# Patient Record
Sex: Female | Born: 1985 | Race: White | Hispanic: No | Marital: Married | Smoking: Former smoker
Health system: Southern US, Community
[De-identification: ages and names within clinical notes are randomized; demographics above are authoritative.]

## PROBLEM LIST (undated history)

## (undated) DIAGNOSIS — R011 Cardiac murmur, unspecified: Secondary | ICD-10-CM

## (undated) HISTORY — PX: NO PAST SURGERIES: SHX2092

---

## 2006-12-10 ENCOUNTER — Emergency Department: Payer: Self-pay | Admitting: General Practice

## 2006-12-31 DIAGNOSIS — O24419 Gestational diabetes mellitus in pregnancy, unspecified control: Secondary | ICD-10-CM

## 2006-12-31 HISTORY — DX: Gestational diabetes mellitus in pregnancy, unspecified control: O24.419

## 2007-02-07 ENCOUNTER — Emergency Department: Payer: Self-pay

## 2007-02-08 ENCOUNTER — Emergency Department: Payer: Self-pay | Admitting: Emergency Medicine

## 2007-04-28 ENCOUNTER — Encounter: Payer: Self-pay | Admitting: Maternal & Fetal Medicine

## 2007-05-04 ENCOUNTER — Other Ambulatory Visit: Payer: Self-pay

## 2007-05-04 ENCOUNTER — Emergency Department: Payer: Self-pay | Admitting: Emergency Medicine

## 2007-05-15 ENCOUNTER — Encounter: Payer: Self-pay | Admitting: Maternal & Fetal Medicine

## 2007-06-18 ENCOUNTER — Inpatient Hospital Stay: Payer: Self-pay

## 2009-03-18 IMAGING — US US OB DETAIL+14 WK - NRPT [facility]
1 series · 14 of 28 positions shown · non-contrast
Comparison: none

[Series 1: us ob detail+14 wk - nrpt [facility] · 0.31mm/px · 14 of 81 slices shown]
[im 3/81]
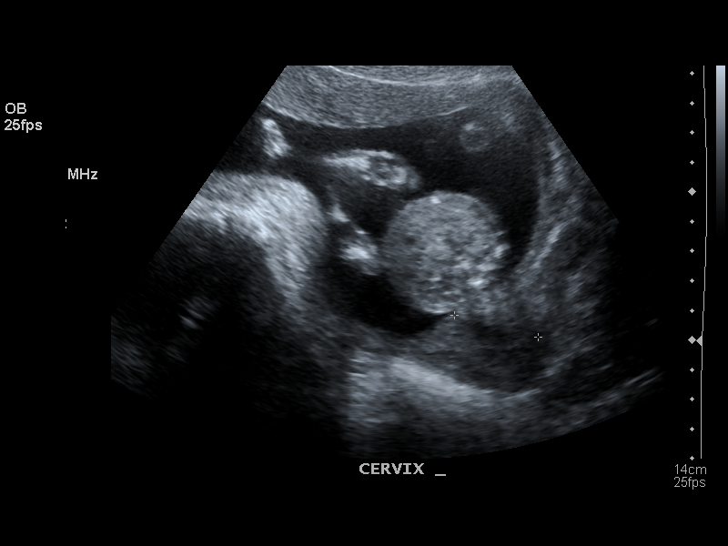
[im 9/81]
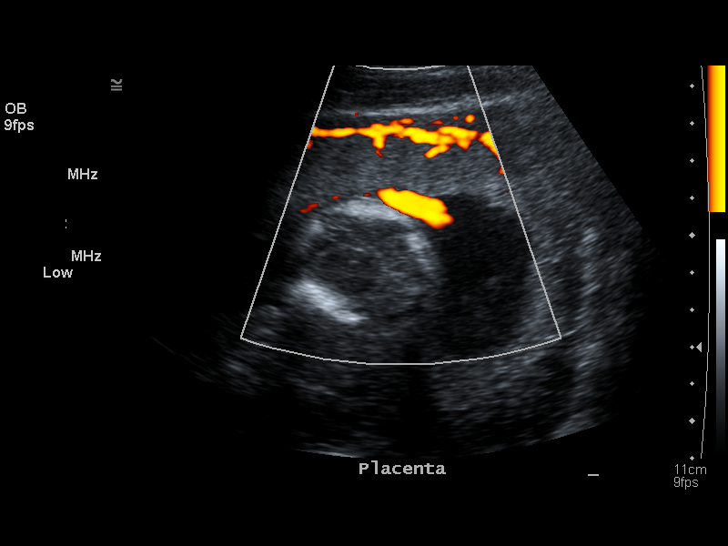
[im 15/81]
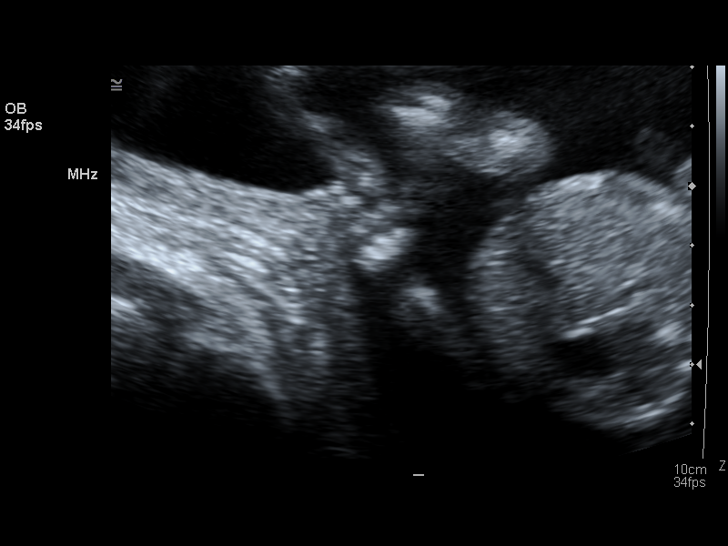
[im 21/81]
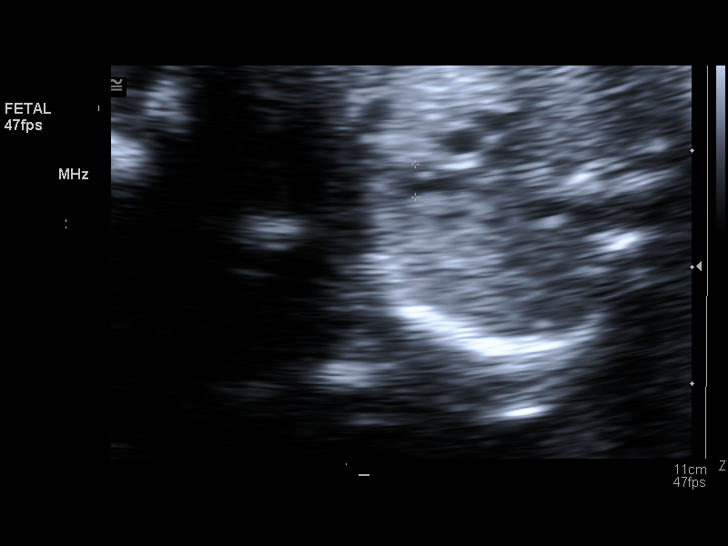
[im 27/81]
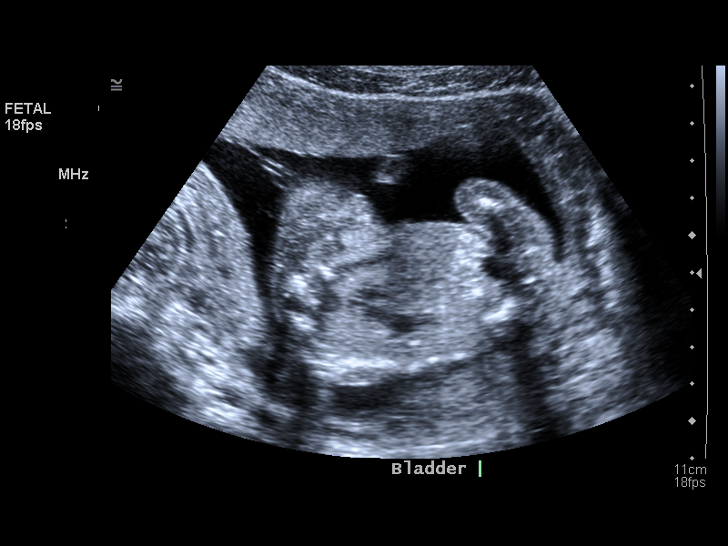
[im 33/81]
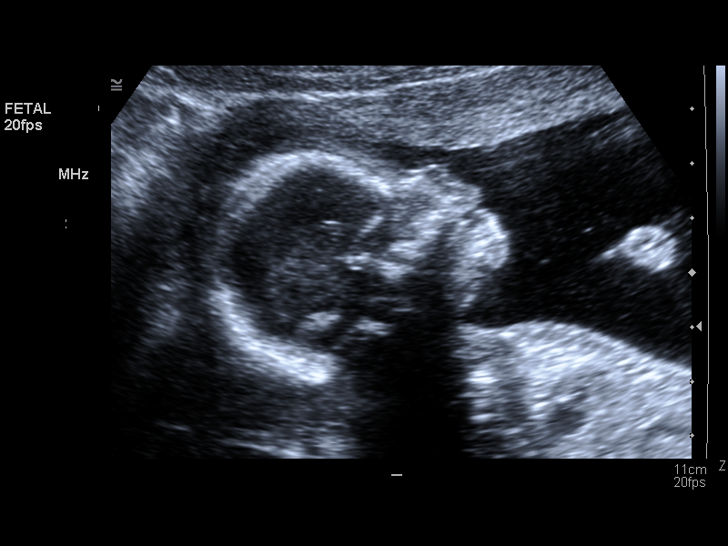
[im 39/81]
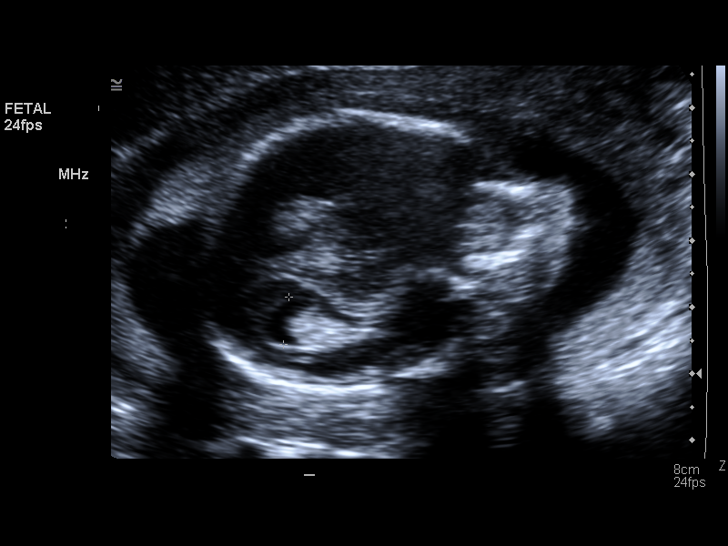
[im 45/81]
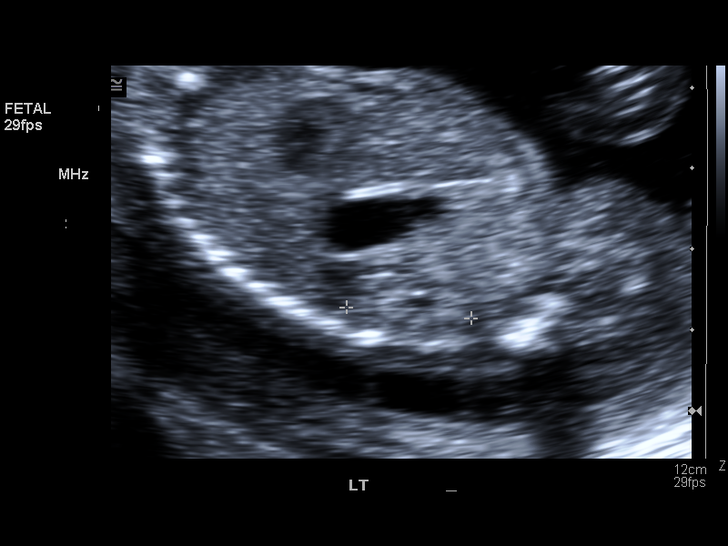
[im 51/81]
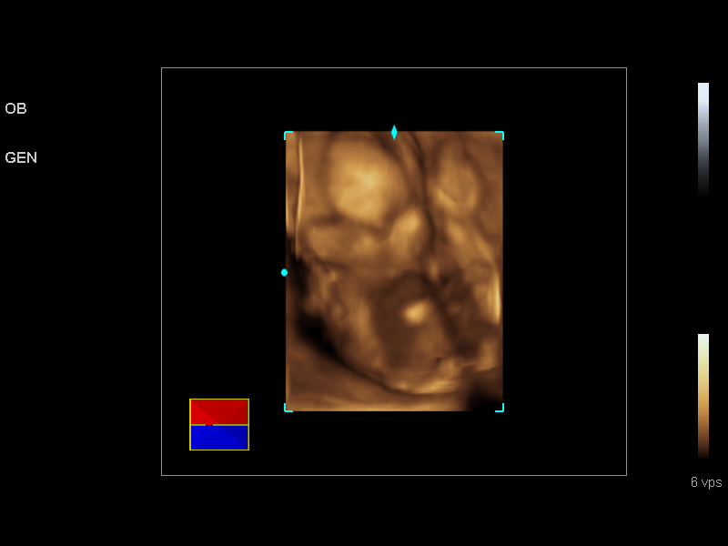
[im 57/81]
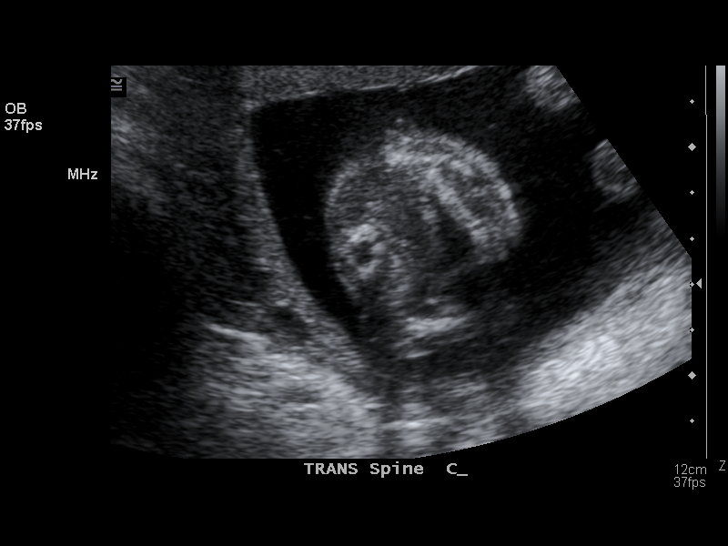
[im 63/81]
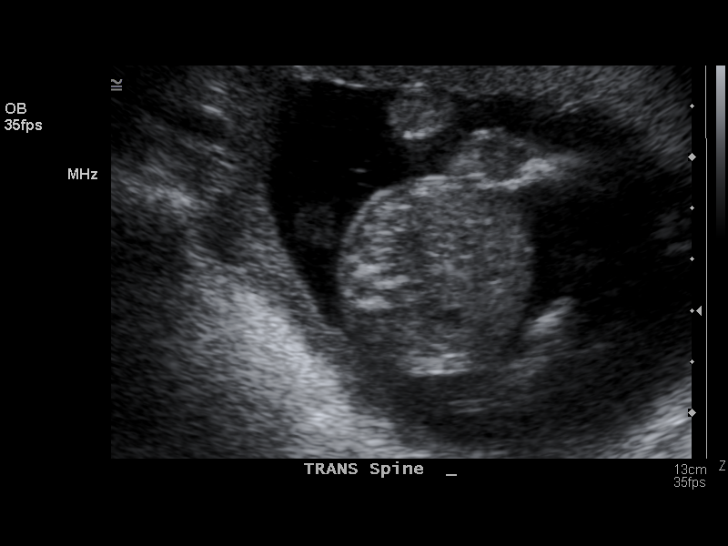
[im 69/81]
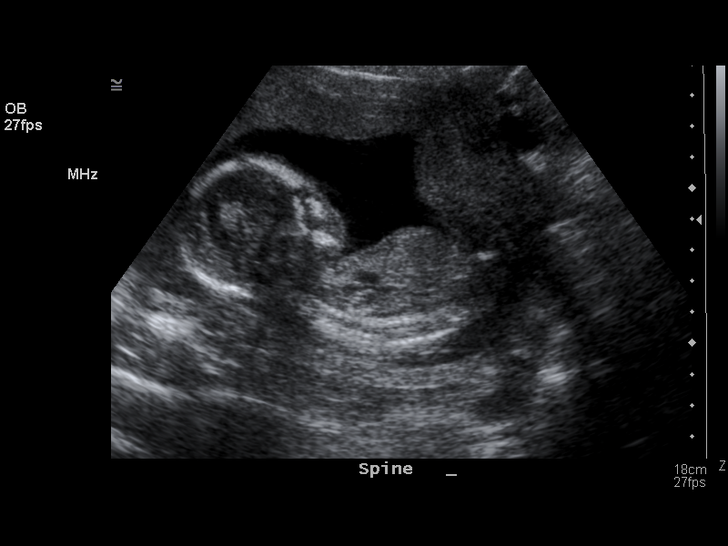
[im 75/81]
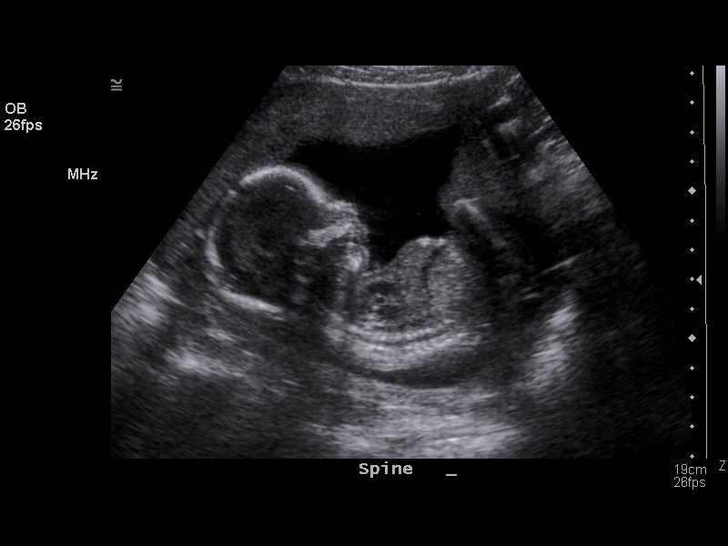
[im 81/81]
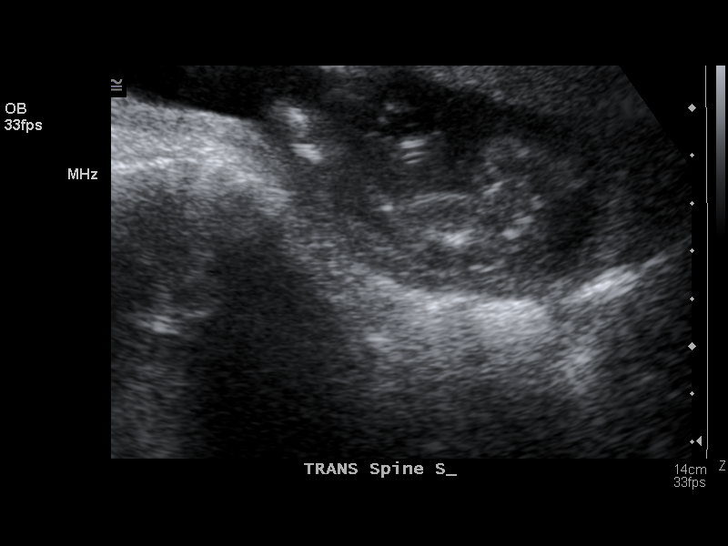

[14 of 28 positions shown; findings below may reference images not displayed]

IMAGES IMPORTED FROM THE [employer] WORKFLOW SYSTEM
NO DICTATION FOR STUDY

## 2017-04-12 ENCOUNTER — Emergency Department
Admission: EM | Admit: 2017-04-12 | Discharge: 2017-04-12 | Disposition: A | Discharge status: Home / Self Care | Payer: Self-pay | Attending: Emergency Medicine | Admitting: Emergency Medicine

## 2017-04-12 ENCOUNTER — Encounter: Payer: Self-pay | Admitting: Emergency Medicine

## 2017-04-12 DIAGNOSIS — K0889 Other specified disorders of teeth and supporting structures: Secondary | ICD-10-CM | POA: Insufficient documentation

## 2017-04-12 MED ORDER — IBUPROFEN 600 MG PO TABS: 600.0000 mg | ORAL_TABLET | Freq: Three times a day (TID) | ORAL | 0 refills | Status: DC | PRN

## 2017-04-12 MED ORDER — LIDOCAINE VISCOUS 2 % MT SOLN
15.0000 mL | Freq: Once | OROMUCOSAL | Status: AC
  Administered 2017-04-12: 15 mL via OROMUCOSAL
  Filled 2017-04-12: qty 15

## 2017-04-12 MED ORDER — TRAMADOL HCL 50 MG PO TABS: 50.0000 mg | ORAL_TABLET | Freq: Four times a day (QID) | ORAL | 0 refills | Status: DC | PRN

## 2017-04-12 MED ORDER — LIDOCAINE VISCOUS 2 % MT SOLN: 5.0000 mL | Freq: Four times a day (QID) | OROMUCOSAL | 0 refills | Status: DC | PRN

## 2017-04-12 NOTE — ED Provider Notes (Signed)
[facility] Emergency Department Provider Note   ____________________________________________   First MD Initiated Contact with Patient 04/12/17 1911     (approximate)  I have reviewed the triage vital signs and the nursing notes.   HISTORY  Chief Complaint Dental Pain    HPI Rebecca Blanchard is a 32 y.o. female patient complaining of dental pain to the right area but was sent tooth was removed 5 years ago. Patient stated this swelling and solid papular lesion at the site of extraction. Patient state pain as a 9/10. Patient described a pain as "penetrating. Patient denies any fever associated this complaint. No palliative measures taken for this complaint.   History reviewed. No pertinent past medical history.  There are no active problems to display for this patient.   No past surgical history on file.  Prior to Admission medications   Medication Sig Start Date End Date Taking? Authorizing Provider  ibuprofen (ADVIL,MOTRIN) 600 MG tablet Take 1 tablet (600 mg total) by mouth every 8 (eight) hours as needed. 04/12/17   Sable Feil, PA-C  lidocaine (XYLOCAINE) 2 % solution Use as directed 5 mLs in the mouth or throat every 6 (six) hours as needed for mouth pain. 04/12/17   Sable Feil, PA-C  traMADol (ULTRAM) 50 MG tablet Take 1 tablet (50 mg total) by mouth every 6 (six) hours as needed for moderate pain. 04/12/17   Sable Feil, PA-C    Allergies Amoxicillin  No family history on file.  Social History Social History  Substance Use Topics  . Smoking status: Not on file  . Smokeless tobacco: Not on file  . Alcohol use Not on file    Review of Systems Constitutional: No fever/chills Eyes: No visual changes. ENT: No sore throat. Dental pain Cardiovascular: Denies chest pain. Respiratory: Denies shortness of breath. Gastrointestinal: No abdominal pain.  No nausea, no vomiting.  No diarrhea.  No constipation. Genitourinary: Negative  for dysuria. Musculoskeletal: Negative for back pain. Skin: Negative for rash. Neurological: Negative for headaches, focal weakness or numbness.    ____________________________________________   PHYSICAL EXAM:  VITAL SIGNS: ED Triage Vitals  Enc Vitals Group     BP 04/12/17 1904 116/75     Pulse Rate 04/12/17 1904 89     Resp 04/12/17 1904 15     Temp 04/12/17 1904 98.7 F (37.1 C)     Temp Source 04/12/17 1904 Oral     SpO2 04/12/17 1904 96 %     Weight 04/12/17 1904 140 lb (63.5 kg)     Height 04/12/17 1904 5\' 5"  (1.651 m)     Head Circumference --      Peak Flow --      Pain Score 04/12/17 1903 9     Pain Loc --      Pain Edu? --      Excl. in [place]? --     Constitutional: Alert and oriented. Well appearing and in no acute distress. Eyes: Conjunctivae are normal. PERRL. EOMI. Head: Atraumatic. Nose: No congestion/rhinnorhea. Mouth/Throat: Mucous membranes are moist.  Oropharynx non-erythematous. Solid papular lesion at extraction site of tooth #32.  Neck: No stridor.  No cervical spine tenderness to palpation. Hematological/Lymphatic/Immunilogical: No cervical lymphadenopathy. Cardiovascular: Normal rate, regular rhythm. Grossly normal heart sounds.  Good peripheral circulation. Respiratory: Normal respiratory effort.  No retractions. Lungs CTAB. Gastrointestinal: Soft and nontender. No distention. No abdominal bruits. No CVA tenderness. Musculoskeletal: No lower extremity tenderness nor edema.  No joint  effusions. Neurologic:  Normal speech and language. No gross focal neurologic deficits are appreciated. No gait instability. Skin:  Skin is warm, dry and intact. No rash noted. Psychiatric: Mood and affect are normal. Speech and behavior are normal.  ____________________________________________   LABS (all labs ordered are listed, but only abnormal results are displayed)  Labs Reviewed - No data to  display ____________________________________________  EKG   ____________________________________________  RADIOLOGY   ____________________________________________   PROCEDURES  Procedure(s) performed: None  Procedures  Critical Care performed: No  ____________________________________________   INITIAL IMPRESSION / ASSESSMENT AND PLAN / ED COURSE  Pertinent labs & imaging results that were available during my care of the patient were reviewed by me and considered in my medical decision making (see chart for details).  Dental pain      ____________________________________________   FINAL CLINICAL IMPRESSION(S) / ED DIAGNOSES  Final diagnoses:  Pain, dental  Patient given discharge care instructions. Patient advised to [facility] for definitive evaluation and treatment.    NEW MEDICATIONS STARTED DURING THIS VISIT:  New Prescriptions   IBUPROFEN (ADVIL,MOTRIN) 600 MG TABLET    Take 1 tablet (600 mg total) by mouth every 8 (eight) hours as needed.   LIDOCAINE (XYLOCAINE) 2 % SOLUTION    Use as directed 5 mLs in the mouth or throat every 6 (six) hours as needed for mouth pain.   TRAMADOL (ULTRAM) 50 MG TABLET    Take 1 tablet (50 mg total) by mouth every 6 (six) hours as needed for moderate pain.     Note:  This document was prepared using Dragon voice recognition software and may include unintentional dictation errors.    Sable Feil, PA-C 04/12/17 1925    Harvest Dark, MD 04/12/17 2259

## 2017-04-12 NOTE — ED Notes (Signed)
Patient c/o lower right jaw pain at site of wisdom tooth (removed 7 years ago). Patient not able to fully open jaw.  Patient reports pain radiates to head and neck.

## 2017-04-12 NOTE — ED Triage Notes (Signed)
Pt reports pain to right side of mouth where wisdom tooth used to be.

## 2017-04-12 NOTE — ED Notes (Signed)
Reviewed d/c instructions, follow-up instructions, and prescriptions with patient. Pt verbalized understanding.

## 2017-07-10 ENCOUNTER — Emergency Department: Payer: Self-pay

## 2017-07-10 ENCOUNTER — Encounter: Payer: Self-pay | Admitting: Intensive Care

## 2017-07-10 ENCOUNTER — Emergency Department
Admission: EM | Admit: 2017-07-10 | Discharge: 2017-07-10 | Disposition: A | Discharge status: Home / Self Care | Payer: Self-pay | Attending: Student in an Organized Health Care Education/Training Program | Admitting: Student in an Organized Health Care Education/Training Program

## 2017-07-10 DIAGNOSIS — R202 Paresthesia of skin: Secondary | ICD-10-CM | POA: Insufficient documentation

## 2017-07-10 DIAGNOSIS — G510 Bell's palsy: Secondary | ICD-10-CM | POA: Insufficient documentation

## 2017-07-10 DIAGNOSIS — F17210 Nicotine dependence, cigarettes, uncomplicated: Secondary | ICD-10-CM | POA: Insufficient documentation

## 2017-07-10 LAB — COMPREHENSIVE METABOLIC PANEL
ALK PHOS: 40 U/L (ref 38–126)
ALT: 13 U/L — AB (ref 14–54)
AST: 20 U/L (ref 15–41)
Albumin: 4.1 g/dL (ref 3.5–5.0)
Anion gap: 9 (ref 5–15)
BILIRUBIN TOTAL: 0.7 mg/dL (ref 0.3–1.2)
BUN: 13 mg/dL (ref 6–20)
CALCIUM: 9 mg/dL (ref 8.9–10.3)
CO2: 22 mmol/L (ref 22–32)
CREATININE: 0.82 mg/dL (ref 0.44–1.00)
Chloride: 107 mmol/L (ref 101–111)
GFR calc Af Amer: >60 mL/min (ref >60)
GFR calc non Af Amer: >60 mL/min (ref >60)
Glucose, Bld: 110 mg/dL — ABNORMAL HIGH (ref 65–99)
POTASSIUM: 3.7 mmol/L (ref 3.5–5.1)
Sodium: 138 mmol/L (ref 135–145)
TOTAL PROTEIN: 7.1 g/dL (ref 6.5–8.1)

## 2017-07-10 LAB — DIFFERENTIAL
BASOS ABS: 0.1 10*3/uL (ref 0–0.1)
Basophils Relative: 1 %
EOS ABS: 0.1 10*3/uL (ref 0–0.7)
Eosinophils Relative: 2 %
LYMPHS ABS: 2.2 10*3/uL (ref 1.0–3.6)
LYMPHS PCT: 28 %
MONOS PCT: 7 %
Monocytes Absolute: 0.6 10*3/uL (ref 0.2–0.9)
Neutro Abs: 4.9 10*3/uL (ref 1.4–6.5)
Neutrophils Relative %: 62 %

## 2017-07-10 LAB — APTT: aPTT: 30 seconds (ref 24–36)

## 2017-07-10 LAB — TROPONIN I

## 2017-07-10 LAB — CBC
HEMATOCRIT: 36.9 % (ref 35.0–47.0)
HEMOGLOBIN: 12.5 g/dL (ref 12.0–16.0)
MCH: 28.8 pg (ref 26.0–34.0)
MCHC: 33.9 g/dL (ref 32.0–36.0)
MCV: 85 fL (ref 80.0–100.0)
Platelets: 222 10*3/uL (ref 150–440)
RBC: 4.34 MIL/uL (ref 3.80–5.20)
RDW: 13.7 % (ref 11.5–14.5)
WBC: 7.9 10*3/uL (ref 3.6–11.0)

## 2017-07-10 LAB — URINALYSIS, COMPLETE (UACMP) WITH MICROSCOPIC
Bilirubin Urine: NEGATIVE
Glucose, UA: NEGATIVE mg/dL
Hgb urine dipstick: NEGATIVE
Ketones, ur: NEGATIVE mg/dL
Leukocytes, UA: NEGATIVE
Nitrite: NEGATIVE
PH: 5 (ref 5.0–8.0)
Protein, ur: 30 mg/dL — AB
SPECIFIC GRAVITY, URINE: 1.033 — AB (ref 1.005–1.030)

## 2017-07-10 LAB — POCT PREGNANCY, URINE: PREG TEST UR: NEGATIVE

## 2017-07-10 LAB — PROTIME-INR
INR: 1.01
Prothrombin Time: 13.3 seconds (ref 11.4–15.2)

## 2017-07-10 LAB — PREGNANCY, URINE: PREG TEST UR: NEGATIVE

## 2017-07-10 MED ORDER — RANITIDINE HCL 150 MG PO CAPS: 150.0000 mg | ORAL_CAPSULE | Freq: Two times a day (BID) | ORAL | 0 refills | Status: DC

## 2017-07-10 MED ORDER — PREDNISONE 20 MG PO TABS
60.0000 mg | ORAL_TABLET | Freq: Every day | ORAL | 0 refills | Status: AC
Start: 2017-07-10 — End: 2017-07-17

## 2017-07-10 NOTE — ED Provider Notes (Signed)
 -----------------------------------------   6:18 PM on 07/10/2017 -----------------------------------------  Patient well appearing no acute distress, vitals remained normal. Repeat troponin negative. MRI essentially unremarkable. Patient informed of MRI findings including the potential schwannoma or paraganglioma, recommended she follow up with a primary care doctor for further evaluation. She is suitable for discharge home at this time. Eating and drinking, calm and comfortable.   Carrie Mew, MD 07/10/17 [phone]

## 2017-07-10 NOTE — Discharge Instructions (Signed)
Results for orders placed or performed during the hospital encounter of 07/10/17  Protime-INR  Result Value Ref Range   Prothrombin Time 13.3 11.4 - 15.2 seconds   INR 1.01   APTT  Result Value Ref Range   aPTT 30 24 - 36 seconds  CBC  Result Value Ref Range   WBC 7.9 3.6 - 11.0 K/uL   RBC 4.34 3.80 - 5.20 MIL/uL   Hemoglobin 12.5 12.0 - 16.0 g/dL   HCT 36.9 35.0 - 47.0 %   MCV 85.0 80.0 - 100.0 fL   MCH 28.8 26.0 - 34.0 pg   MCHC 33.9 32.0 - 36.0 g/dL   RDW 13.7 11.5 - 14.5 %   Platelets 222 150 - 440 K/uL  Differential  Result Value Ref Range   Neutrophils Relative % 62 %   Neutro Abs 4.9 1.4 - 6.5 K/uL   Lymphocytes Relative 28 %   Lymphs Abs 2.2 1.0 - 3.6 K/uL   Monocytes Relative 7 %   Monocytes Absolute 0.6 0.2 - 0.9 K/uL   Eosinophils Relative 2 %   Eosinophils Absolute 0.1 0 - 0.7 K/uL   Basophils Relative 1 %   Basophils Absolute 0.1 0 - 0.1 K/uL  Comprehensive metabolic panel  Result Value Ref Range   Sodium 138 135 - 145 mmol/L   Potassium 3.7 3.5 - 5.1 mmol/L   Chloride 107 101 - 111 mmol/L   CO2 22 22 - 32 mmol/L   Glucose, Bld 110 (H) 65 - 99 mg/dL   BUN 13 6 - 20 mg/dL   Creatinine, Ser 0.82 0.44 - 1.00 mg/dL   Calcium 9.0 8.9 - 10.3 mg/dL   Total Protein 7.1 6.5 - 8.1 g/dL   Albumin 4.1 3.5 - 5.0 g/dL   AST 20 15 - 41 U/L   ALT 13 (L) 14 - 54 U/L   Alkaline Phosphatase 40 38 - 126 U/L   Total Bilirubin 0.7 0.3 - 1.2 mg/dL   GFR calc non Af Amer >60 >60 mL/min   GFR calc Af Amer >60 >60 mL/min   Anion gap 9 5 - 15  Troponin I  Result Value Ref Range   Troponin I <0.03 <0.03 ng/mL  Urinalysis, Complete w Microscopic  Result Value Ref Range   Color, Urine YELLOW (A) YELLOW   APPearance HAZY (A) CLEAR   Specific Gravity, Urine 1.033 (H) 1.005 - 1.030   pH 5.0 5.0 - 8.0   Glucose, UA NEGATIVE NEGATIVE mg/dL   Hgb urine dipstick NEGATIVE NEGATIVE   Bilirubin Urine NEGATIVE NEGATIVE   Ketones, ur NEGATIVE NEGATIVE mg/dL   Protein, ur 30 (A)  NEGATIVE mg/dL   Nitrite NEGATIVE NEGATIVE   Leukocytes, UA NEGATIVE NEGATIVE   RBC / HPF 0-5 0 - 5 RBC/hpf   WBC, UA 0-5 0 - 5 WBC/hpf   Bacteria, UA RARE (A) NONE SEEN   Squamous Epithelial / LPF 6-30 (A) NONE SEEN   Mucous PRESENT   Pregnancy, urine  Result Value Ref Range   Preg Test, Ur NEGATIVE NEGATIVE  Troponin I  Result Value Ref Range   Troponin I <0.03 <0.03 ng/mL  Pregnancy, urine POC  Result Value Ref Range   Preg Test, Ur NEGATIVE NEGATIVE   Ct Head Blanchard Contrast  Result Date: 07/10/2017 CLINICAL DATA:  Numbness on face and right from. EXAM: CT HEAD WITHOUT CONTRAST TECHNIQUE: Contiguous axial images were obtained from the base of the skull through the vertex without intravenous contrast. COMPARISON:  None.  FINDINGS: Brain: No mass lesion, hemorrhage, hydrocephalus, acute infarct, intra-axial, or extra-axial fluid collection. Vascular: No hyperdense vessel or unexpected calcification. Skull: Normal Sinuses/Orbits: Normal imaged portions of the orbits and globes. Clear paranasal sinuses and mastoid air cells. Other: None. IMPRESSION: Normal head CT. Electronically Signed   By: Rebecca Blanchard M.D.   On: 07/10/2017 11:56   Rebecca Blanchard Contrast  Result Date: 07/10/2017 CLINICAL DATA:  32 y/o F; right facial weakness and tingling of the right upper extremity. EXAM: MRI HEAD WITHOUT CONTRAST MRI CERVICAL SPINE WITHOUT CONTRAST TECHNIQUE: Multiplanar, multiecho pulse sequences of the brain and surrounding structures, and cervical spine, to include the craniocervical junction and cervicothoracic junction, were obtained without intravenous contrast. COMPARISON:  None. FINDINGS: MRI HEAD FINDINGS Brain: No acute infarction, hemorrhage, hydrocephalus, extra-axial collection or mass lesion. No abnormal T2 FLAIR signal abnormality of the brain. Vascular: Normal flow voids. Skull and upper cervical spine: Normal marrow signal. Sinuses/Orbits: Mild ethmoid and right maxillary sinus mucosal  thickening. Right maxillary sinus mucous retention cyst. No abnormal signal of mastoid air cells. Orbits are normal. Other: None. MRI CERVICAL SPINE FINDINGS Alignment: Physiologic. Vertebrae: No fracture, evidence of discitis, or bone lesion. Cord: Normal signal and morphology. Posterior Fossa, vertebral arteries, paraspinal tissues: T2 hyperintense lesion measuring 8 x 10 x 16 mm (AP x ML x CC series 16, image 21 and series 14, image 5 within the right prevertebral space centered between common carotid artery and esophagus at C6-7 level. T2 signal intensities increased compared to lymph nodes in the neck.) Disc levels: C2-3: No significant disc displacement, foraminal narrowing, or canal stenosis. C3-4: No significant disc displacement, foraminal narrowing, or canal stenosis. C4-5: Minimal disc bulge. No significant foraminal or canal stenosis. C5-6: Small central disc protrusion with ventral thecal sac effacement and minimal anterior cord flattening. No significant foraminal or canal stenosis. C6-7: Minimal disc bulge. No significant foraminal or canal stenosis. C7-T1: No significant disc displacement, foraminal narrowing, or canal stenosis. IMPRESSION: MRI head: 1. No acute intracranial abnormality. 2. Mild ethmoid and right maxillary sinus disease. 3. Otherwise unremarkable MRI of the brain. MRI cervical spine: 1. Mild discogenic degenerative changes greatest at the C5-6 level with there is mild disc space narrowing and a small central protrusion with ventral thecal sac effacement and minimal anterior cord flattening. 2. No significant foraminal or canal stenosis. No acute osseous abnormality. 3. 16 mm lesion in the right prevertebral space at the C6-7 level, possibly a schwannoma, paraganglioma, or reactive lymph node. Consider CT or MRI of the neck with contrast for further evaluation. Electronically Signed   By: Rebecca Blanchard M.D.   On: 07/10/2017 17:17   Rebecca Blanchard Contrast  Result  Date: 07/10/2017 CLINICAL DATA:  32 y/o F; right facial weakness and tingling of the right upper extremity. EXAM: MRI HEAD WITHOUT CONTRAST MRI CERVICAL SPINE WITHOUT CONTRAST TECHNIQUE: Multiplanar, multiecho pulse sequences of the brain and surrounding structures, and cervical spine, to include the craniocervical junction and cervicothoracic junction, were obtained without intravenous contrast. COMPARISON:  None. FINDINGS: MRI HEAD FINDINGS Brain: No acute infarction, hemorrhage, hydrocephalus, extra-axial collection or mass lesion. No abnormal T2 FLAIR signal abnormality of the brain. Vascular: Normal flow voids. Skull and upper cervical spine: Normal marrow signal. Sinuses/Orbits: Mild ethmoid and right maxillary sinus mucosal thickening. Right maxillary sinus mucous retention cyst. No abnormal signal of mastoid air cells. Orbits are normal. Other: None. MRI CERVICAL SPINE FINDINGS Alignment: Physiologic. Vertebrae: No fracture, evidence of discitis, or bone lesion.  Cord: Normal signal and morphology. Posterior Fossa, vertebral arteries, paraspinal tissues: T2 hyperintense lesion measuring 8 x 10 x 16 mm (AP x ML x CC series 16, image 21 and series 14, image 5 within the right prevertebral space centered between common carotid artery and esophagus at C6-7 level. T2 signal intensities increased compared to lymph nodes in the neck.) Disc levels: C2-3: No significant disc displacement, foraminal narrowing, or canal stenosis. C3-4: No significant disc displacement, foraminal narrowing, or canal stenosis. C4-5: Minimal disc bulge. No significant foraminal or canal stenosis. C5-6: Small central disc protrusion with ventral thecal sac effacement and minimal anterior cord flattening. No significant foraminal or canal stenosis. C6-7: Minimal disc bulge. No significant foraminal or canal stenosis. C7-T1: No significant disc displacement, foraminal narrowing, or canal stenosis. IMPRESSION: MRI head: 1. No acute  intracranial abnormality. 2. Mild ethmoid and right maxillary sinus disease. 3. Otherwise unremarkable MRI of the brain. MRI cervical spine: 1. Mild discogenic degenerative changes greatest at the C5-6 level with there is mild disc space narrowing and a small central protrusion with ventral thecal sac effacement and minimal anterior cord flattening. 2. No significant foraminal or canal stenosis. No acute osseous abnormality. 3. 16 mm lesion in the right prevertebral space at the C6-7 level, possibly a schwannoma, paraganglioma, or reactive lymph node. Consider CT or MRI of the neck with contrast for further evaluation. Electronically Signed   By: Rebecca Blanchard M.D.   On: 07/10/2017 17:17

## 2017-07-10 NOTE — ED Notes (Signed)
Pt verbalizes understanding of d/c teaching and RX. Pt denies any questions. RN did teach back.

## 2017-07-10 NOTE — ED Notes (Signed)
Pt to ed with c/o face numbness yesterday, states "yesterday around 1pm, when I made a kiss face the Right side of my face wouldn't move" Patient c/o numbness on face and Right thumb radiating up arm. No weakness noted. Bilateral hand grips strong Speech clear. No problems swallowing. No facial drooping noted.  Pt alert and oriented and appears in nad at this time.  Family at bedside.

## 2017-07-10 NOTE — ED Notes (Signed)
Sandwich tray given at this time 

## 2017-07-10 NOTE — ED Triage Notes (Signed)
PAtient states "yesterday around 1pm, when I make a kiss face the Right side of my face wouldn't move" Patient c/o numbness on face and Right thumb radiating up arm. No weakness noted. Bilateral hand grips strong Speech clear. No problems swallowing.

## 2017-07-10 NOTE — ED Notes (Addendum)
Pt awaiting MRI 

## 2017-07-10 NOTE — ED Provider Notes (Signed)
[facility] Emergency Department Provider Note    First MD Initiated Contact with Patient 07/10/17 1413     (approximate)  I have reviewed the triage vital signs and the nursing notes.   HISTORY  Chief Complaint Numbness    HPI TORRANCE FRECH is a 32 y.o. female presents with weakness and tingling to her right face around her mouth associated with tingling over the right thumb that started yesterday at 1 PM. Patient denies any headache. No trauma. No recent illness. Never had symptoms like this before. Denies any associated blurry vision. No slurred speech. No weakness. Denies any chest pain or neck pain.   History reviewed. No pertinent past medical history. History reviewed. No pertinent family history. History reviewed. No pertinent surgical history. There are no active problems to display for this patient.     Prior to Admission medications   Medication Sig Start Date End Date Taking? Authorizing Provider  ibuprofen (ADVIL,MOTRIN) 600 MG tablet Take 1 tablet (600 mg total) by mouth every 8 (eight) hours as needed. 04/12/17   Sable Feil, PA-C  lidocaine (XYLOCAINE) 2 % solution Use as directed 5 mLs in the mouth or throat every 6 (six) hours as needed for mouth pain. 04/12/17   Sable Feil, PA-C  traMADol (ULTRAM) 50 MG tablet Take 1 tablet (50 mg total) by mouth every 6 (six) hours as needed for moderate pain. 04/12/17   Sable Feil, PA-C    Allergies Amoxicillin    Social History Social History  Substance Use Topics  . Smoking status: Current Every Day Smoker    Packs/day: 0.50    Types: Cigarettes  . Smokeless tobacco: Never Used  . Alcohol use No    Review of Systems Patient denies headaches, rhinorrhea, blurry vision, numbness, shortness of breath, chest pain, edema, cough, abdominal pain, nausea, vomiting, diarrhea, dysuria, fevers, rashes or hallucinations unless otherwise stated above in  HPI. ____________________________________________   PHYSICAL EXAM:  VITAL SIGNS: Vitals:   07/10/17 1125  BP: (!) 129/57  Pulse: 61  Resp: 18  Temp: 98.9 F (37.2 C)    Constitutional: Alert and oriented. Well appearing and in no acute distress. Eyes: Conjunctivae are normal.  Head: Atraumatic. Nose: No congestion/rhinnorhea. Mouth/Throat: Mucous membranes are moist.   Neck: No stridor. Painless ROM.  Cardiovascular: Normal rate, regular rhythm. Grossly normal heart sounds.  Good peripheral circulation. Respiratory: Normal respiratory effort.  No retractions. Lungs CTAB. Gastrointestinal: Soft and nontender. No distention. No abdominal bruits. No CVA tenderness. Musculoskeletal: No lower extremity tenderness nor edema.  No joint effusions. Neurologic:  Normal speech and language. No gross focal neurologic deficits are appreciated. Minimal weakness to right face sparring the forehead.  No overt droop.  Subjective numbness or right thumb. Skin:  Skin is warm, dry and intact. No rash noted. Psychiatric: Mood and affect are normal. Speech and behavior are normal.  ____________________________________________   LABS (all labs ordered are listed, but only abnormal results are displayed)  Results for orders placed or performed during the hospital encounter of 07/10/17 (from the past 24 hour(s))  Protime-INR     Status: None   Collection Time: 07/10/17 11:30 AM  Result Value Ref Range   Prothrombin Time 13.3 11.4 - 15.2 seconds   INR 1.01   APTT     Status: None   Collection Time: 07/10/17 11:30 AM  Result Value Ref Range   aPTT 30 24 - 36 seconds  CBC  Status: None   Collection Time: 07/10/17 11:30 AM  Result Value Ref Range   WBC 7.9 3.6 - 11.0 K/uL   RBC 4.34 3.80 - 5.20 MIL/uL   Hemoglobin 12.5 12.0 - 16.0 g/dL   HCT 36.9 35.0 - 47.0 %   MCV 85.0 80.0 - 100.0 fL   MCH 28.8 26.0 - 34.0 pg   MCHC 33.9 32.0 - 36.0 g/dL   RDW 13.7 11.5 - 14.5 %   Platelets 222 150 -  440 K/uL  Differential     Status: None   Collection Time: 07/10/17 11:30 AM  Result Value Ref Range   Neutrophils Relative % 62 %   Neutro Abs 4.9 1.4 - 6.5 K/uL   Lymphocytes Relative 28 %   Lymphs Abs 2.2 1.0 - 3.6 K/uL   Monocytes Relative 7 %   Monocytes Absolute 0.6 0.2 - 0.9 K/uL   Eosinophils Relative 2 %   Eosinophils Absolute 0.1 0 - 0.7 K/uL   Basophils Relative 1 %   Basophils Absolute 0.1 0 - 0.1 K/uL  Comprehensive metabolic panel     Status: Abnormal   Collection Time: 07/10/17 11:30 AM  Result Value Ref Range   Sodium 138 135 - 145 mmol/L   Potassium 3.7 3.5 - 5.1 mmol/L   Chloride 107 101 - 111 mmol/L   CO2 22 22 - 32 mmol/L   Glucose, Bld 110 (H) 65 - 99 mg/dL   BUN 13 6 - 20 mg/dL   Creatinine, Ser 0.82 0.44 - 1.00 mg/dL   Calcium 9.0 8.9 - 10.3 mg/dL   Total Protein 7.1 6.5 - 8.1 g/dL   Albumin 4.1 3.5 - 5.0 g/dL   AST 20 15 - 41 U/L   ALT 13 (L) 14 - 54 U/L   Alkaline Phosphatase 40 38 - 126 U/L   Total Bilirubin 0.7 0.3 - 1.2 mg/dL   GFR calc non Af Amer >60 >60 mL/min   GFR calc Af Amer >60 >60 mL/min   Anion gap 9 5 - 15  Troponin I     Status: None   Collection Time: 07/10/17 11:30 AM  Result Value Ref Range   Troponin I <0.03 <0.03 ng/mL  Urinalysis, Complete w Microscopic     Status: Abnormal   Collection Time: 07/10/17 11:36 AM  Result Value Ref Range   Color, Urine YELLOW (A) YELLOW   APPearance HAZY (A) CLEAR   Specific Gravity, Urine 1.033 (H) 1.005 - 1.030   pH 5.0 5.0 - 8.0   Glucose, UA NEGATIVE NEGATIVE mg/dL   Hgb urine dipstick NEGATIVE NEGATIVE   Bilirubin Urine NEGATIVE NEGATIVE   Ketones, ur NEGATIVE NEGATIVE mg/dL   Protein, ur 30 (A) NEGATIVE mg/dL   Nitrite NEGATIVE NEGATIVE   Leukocytes, UA NEGATIVE NEGATIVE   RBC / HPF 0-5 0 - 5 RBC/hpf   WBC, UA 0-5 0 - 5 WBC/hpf   Bacteria, UA RARE (A) NONE SEEN   Squamous Epithelial / LPF 6-30 (A) NONE SEEN   Mucous PRESENT   Pregnancy, urine POC     Status: None   Collection  Time: 07/10/17 11:39 AM  Result Value Ref Range   Preg Test, Ur NEGATIVE NEGATIVE   ____________________________________________  EKG My review and personal interpretation at Time: 11:24   Indication: tingling  Rate: 65  Rhythm: sinus Axis: normal Other:  Normal intervals, no avute abnormality ____________________________________________  RADIOLOGY  I personally reviewed all radiographic images ordered to evaluate for the above acute complaints  and reviewed radiology reports and findings.  These findings were personally discussed with the patient.  Please see medical record for radiology report.  ____________________________________________   PROCEDURES  Procedure(s) performed:  Procedures    Critical Care performed: no ____________________________________________   INITIAL IMPRESSION / ASSESSMENT AND PLAN / ED COURSE  Pertinent labs & imaging results that were available during my care of the patient were reviewed by me and considered in my medical decision making (see chart for details).  DDX: bells, cva, ms, neuropathy  CARLESHA SEIPLE is a 32 y.o. who presents to the ED with right facial tingling injury as described above. CT imaging ordered evaluate for mass or ischemia shows none. Blood work is reassuring. Based on her presentation do feel patient will require MRI to further evaluate for acute ischemic event. Do suspect some partial Bell's palsy as etiology of symptoms. Patient be signed out to Dr. Joni Fears pending results of MRI.  Have discussed with the patient and available family all diagnostics and treatments performed thus far and all questions were answered to the best of my ability. The patient demonstrates understanding and agreement with plan.       ____________________________________________   FINAL CLINICAL IMPRESSION(S) / ED DIAGNOSES  Final diagnoses:  Paresthesias      NEW MEDICATIONS STARTED DURING THIS VISIT:  New Prescriptions   No  medications on file     Note:  This document was prepared using Dragon voice recognition software and may include unintentional dictation errors.    Merlyn Lot, MD 07/10/17 [phone]

## 2017-07-10 NOTE — ED Notes (Signed)
Patient transported to MRI 

## 2017-07-10 NOTE — ED Notes (Signed)
Called alea in the lab and added urine preg to labs.

## 2017-10-15 ENCOUNTER — Encounter: Payer: Self-pay | Admitting: Emergency Medicine

## 2017-10-15 ENCOUNTER — Emergency Department: Payer: Self-pay

## 2017-10-15 ENCOUNTER — Emergency Department
Admission: EM | Admit: 2017-10-15 | Discharge: 2017-10-15 | Disposition: A | Discharge status: Home / Self Care | Payer: Self-pay | Attending: Emergency Medicine | Admitting: Emergency Medicine

## 2017-10-15 DIAGNOSIS — F17210 Nicotine dependence, cigarettes, uncomplicated: Secondary | ICD-10-CM | POA: Insufficient documentation

## 2017-10-15 DIAGNOSIS — M940 Chondrocostal junction syndrome [Tietze]: Secondary | ICD-10-CM | POA: Insufficient documentation

## 2017-10-15 DIAGNOSIS — R0789 Other chest pain: Secondary | ICD-10-CM

## 2017-10-15 MED ORDER — CYCLOBENZAPRINE HCL 5 MG PO TABS: 5.0000 mg | ORAL_TABLET | Freq: Three times a day (TID) | ORAL | 0 refills | Status: DC | PRN

## 2017-10-15 MED ORDER — PREDNISONE 10 MG (21) PO TBPK: ORAL_TABLET | ORAL | 0 refills | Status: DC

## 2017-10-15 NOTE — ED Provider Notes (Signed)
[facility] Department Provider Note   ____________________________________________   I have reviewed the triage vital signs and the nursing notes.   HISTORY  Chief Complaint Chest Pain    HPI Rebecca Blanchard is a 32 y.o. female Presents to the emergency department with chest wall pain that has persisted for several days. Patient reports pain with breathing and with palpation along the chest wall. Patient denies any traumatic injury contributing to current symptoms. Patient denies any recent illness associated with current symptoms as well. Patiently notes recently she has been coughing a lot and that has contributed to increasing pain along the chest wall. Patient is a half a pack per day smoker. She states she has been a smoker majority of her life. Patient denies fever, chills, headache, vision changes, chest pain, chest tightness, shortness of breath, abdominal pain, nausea and vomiting.  History reviewed. No pertinent past medical history.  There are no active problems to display for this patient.   History reviewed. No pertinent surgical history.  Prior to Admission medications   Medication Sig Start Date End Date Taking? Authorizing Provider  cyclobenzaprine (FLEXERIL) 5 MG tablet Take 1 tablet (5 mg total) by mouth 3 (three) times daily as needed. 10/15/17   Little, Traci M, PA-C  ibuprofen (ADVIL,MOTRIN) 600 MG tablet Take 1 tablet (600 mg total) by mouth every 8 (eight) hours as needed. 04/12/17   Sable Feil, PA-C  lidocaine (XYLOCAINE) 2 % solution Use as directed 5 mLs in the mouth or throat every 6 (six) hours as needed for mouth pain. Patient not taking: Reported on 07/10/2017 04/12/17   Sable Feil, PA-C  predniSONE (STERAPRED UNI-PAK 21 TAB) 10 MG (21) TBPK tablet Take 6 tablets on day 1. Take 5 tablets on day 2. Take 4 tablets on day 3. Take 3 tablets on day 4. Take 2 tablets on day 5. Take 1 tablets on day 6. 10/15/17    Little, Traci M, PA-C  ranitidine (ZANTAC) 150 MG capsule Take 1 capsule (150 mg total) by mouth 2 (two) times daily. 07/10/17   Carrie Mew, MD  traMADol (ULTRAM) 50 MG tablet Take 1 tablet (50 mg total) by mouth every 6 (six) hours as needed for moderate pain. Patient not taking: Reported on 07/10/2017 04/12/17   Sable Feil, PA-C    Allergies Amoxicillin  No family history on file.  Social History Social History  Substance Use Topics  . Smoking status: Current Every Day Smoker    Packs/day: 0.50    Types: Cigarettes  . Smokeless tobacco: Never Used  . Alcohol use No    Review of Systems Constitutional: Negative for fever/chills ENT:  Negative for sore throat and for difficulty swallowing Cardiovascular: positive for chest wall pain Respiratory: nonproductive intermittent cough. Denies shortness of breath. Musculoskeletal: positive for chest wall pain Skin: Negative for rash. Neurological: Negative for headaches. Able to ambulate. ____________________________________________   PHYSICAL EXAM:  VITAL SIGNS: ED Triage Vitals  Enc Vitals Group     BP 10/15/17 1738 124/73     Pulse Rate 10/15/17 1738 88     Resp 10/15/17 1738 16     Temp 10/15/17 1738 99.4 F (37.4 C)     Temp Source 10/15/17 1738 Oral     SpO2 10/15/17 1738 98 %     Weight 10/15/17 1739 150 lb (68 kg)     Height 10/15/17 1739 5\' 5"  (1.651 m)     Head Circumference --  Peak Flow --      Pain Score 10/15/17 1740 2     Pain Loc --      Pain Edu? --      Excl. in [place]? --     Constitutional: Alert and oriented. Well appearing and in no acute distress.  Head: Normocephalic and atraumatic. Cardiovascular: Normal rate, regular rhythm.  Respiratory: Normal respiratory effort without tachypnea or retractions. Lungs CTAB. No wheezes/rales/rhonchi. Good air entry to the bases with no decreased or absent breath sounds. Gastrointestinal: Bowel sounds 4 quadrants. Soft and nontender to palpation.    Musculoskeletal: Nontender with normal range of motion in all extremities. Neurologic: Normal speech and language. Chest wall tender to palpation along anterior ribs and sternum. No deformities or asymmetry noted.  Skin:  Skin is warm, dry and intact. No rash noted. Psychiatric: Mood and affect are normal. Speech and behavior are normal. Patient exhibits appropriate insight and judgement.  ____________________________________________   LABS (all labs ordered are listed, but only abnormal results are displayed)  Labs Reviewed - No data to display ____________________________________________  EKG none ____________________________________________  RADIOLOGY DG chest  IMPRESSION: Mild bronchitic changes without infiltrate. ____________________________________________   PROCEDURES  Procedure(s) performed: no    Critical Care performed: no ____________________________________________   INITIAL IMPRESSION / ASSESSMENT AND PLAN / ED COURSE  Pertinent labs & imaging results that were available during my care of the patient were reviewed by me and considered in my medical decision making (see chart for details).  Patient presents to emergency department with chest wall pain for several days with traumatic injury. History, physical exam findings and imaging are consistent with costochondritis. Radiograph imaging review and unremarkable for fracture or clinical relevance to patient's symptoms.  Patient will be prescribed prednisone taper and flexeril for muscle spasm . Patient advised to follow up with PCP as needed or return to the emergency department if symptoms return or worsen. Patient informed of clinical course, understand medical decision-making process, and agree with plan.  ____________________________________________   FINAL CLINICAL IMPRESSION(S) / ED DIAGNOSES  Final diagnoses:  Chest wall pain  Costochondritis       NEW MEDICATIONS STARTED DURING THIS  VISIT:  New Prescriptions   CYCLOBENZAPRINE (FLEXERIL) 5 MG TABLET    Take 1 tablet (5 mg total) by mouth 3 (three) times daily as needed.   PREDNISONE (STERAPRED UNI-PAK 21 TAB) 10 MG (21) TBPK TABLET    Take 6 tablets on day 1. Take 5 tablets on day 2. Take 4 tablets on day 3. Take 3 tablets on day 4. Take 2 tablets on day 5. Take 1 tablets on day 6.     Note:  This document was prepared using Dragon voice recognition software and may include unintentional dictation errors.    Jerolyn Shin, PA-C 10/15/17 Penne Lash, MD 10/16/17 [phone]

## 2017-10-15 NOTE — ED Triage Notes (Signed)
Pt with bilateral rib pain, only has pain upon palpation.

## 2017-10-15 NOTE — ED Notes (Signed)

## 2017-10-15 NOTE — Discharge Instructions (Signed)
Take medication as prescribed. Return to emergency department if symptoms worsen and follow-up with PCP as needed.   °

## 2018-05-05 ENCOUNTER — Encounter: Payer: Self-pay | Admitting: Emergency Medicine

## 2018-05-05 ENCOUNTER — Emergency Department
Admission: EM | Admit: 2018-05-05 | Discharge: 2018-05-05 | Disposition: A | Discharge status: Home / Self Care | Payer: Self-pay | Attending: Emergency Medicine | Admitting: Emergency Medicine

## 2018-05-05 DIAGNOSIS — F17210 Nicotine dependence, cigarettes, uncomplicated: Secondary | ICD-10-CM | POA: Insufficient documentation

## 2018-05-05 DIAGNOSIS — L0231 Cutaneous abscess of buttock: Secondary | ICD-10-CM | POA: Insufficient documentation

## 2018-05-05 MED ORDER — LIDOCAINE HCL (PF) 1 % IJ SOLN
5.0000 mL | Freq: Once | INTRAMUSCULAR | Status: DC
  Filled 2018-05-05: qty 5

## 2018-05-05 MED ORDER — OXYCODONE-ACETAMINOPHEN 7.5-325 MG PO TABS
1.0000 | ORAL_TABLET | Freq: Once | ORAL | Status: AC
  Administered 2018-05-05: 1 via ORAL
  Filled 2018-05-05: qty 1

## 2018-05-05 MED ORDER — SULFAMETHOXAZOLE-TRIMETHOPRIM 800-160 MG PO TABS: 1.0000 | ORAL_TABLET | Freq: Two times a day (BID) | ORAL | 0 refills | Status: DC

## 2018-05-05 MED ORDER — ONDANSETRON 4 MG PO TBDP
4.0000 mg | ORAL_TABLET | Freq: Once | ORAL | Status: AC
  Administered 2018-05-05: 4 mg via ORAL
  Filled 2018-05-05: qty 1

## 2018-05-05 MED ORDER — HYDROCODONE-ACETAMINOPHEN 5-325 MG PO TABS: 1.0000 | ORAL_TABLET | ORAL | 0 refills | Status: DC | PRN

## 2018-05-05 NOTE — ED Provider Notes (Signed)
[facility] Emergency Department Provider Note  ____________________________________________   First MD Initiated Contact with Patient 05/05/18 [phone]     (approximate)  I have reviewed the triage vital signs and the nursing notes.   HISTORY  Chief Complaint Abscess   HPI Rebecca Blanchard is a 33 y.o. female is here today with her husband with complaint of an abscess in the vaginal area.  Patient states this started last week.  She states that is progressively gotten worse until she cannot sit without pain.  Patient denies any previous problems such as this.  She denies any fever or chills.  Patient is adamant that we not use any needles and voices that she does not want to be here at all.  Currently she rates her pain as a 10/10.   History reviewed. No pertinent past medical history.  There are no active problems to display for this patient.   History reviewed. No pertinent surgical history.  Prior to Admission medications   Medication Sig Start Date End Date Taking? Authorizing Provider  HYDROcodone-acetaminophen (NORCO/VICODIN) 5-325 MG tablet Take 1 tablet by mouth every 4 (four) hours as needed for moderate pain. 05/05/18   Johnn Hai, PA-C  sulfamethoxazole-trimethoprim (BACTRIM DS,SEPTRA DS) 800-160 MG tablet Take 1 tablet by mouth 2 (two) times daily. 05/05/18   Johnn Hai, PA-C    Allergies Amoxicillin  No family history on file.  Social History Social History   Tobacco Use  . Smoking status: Current Every Day Smoker    Packs/day: 0.50    Types: Cigarettes  . Smokeless tobacco: Never Used  Substance Use Topics  . Alcohol use: No  . Drug use: Not on file    Review of Systems Constitutional: No fever/chills Cardiovascular: Denies chest pain. Respiratory: Denies shortness of breath. Gastrointestinal: No abdominal pain.  No nausea, no vomiting.  Genitourinary: Negative for dysuria. Musculoskeletal: Negative for back  pain. Skin: Positive for abscess. Neurological: Negative for headaches, focal weakness or numbness. ___________________________________________   PHYSICAL EXAM:  VITAL SIGNS: ED Triage Vitals  Enc Vitals Group     BP 05/05/18 0834 121/63     Pulse Rate 05/05/18 0834 74     Resp 05/05/18 0834 18     Temp 05/05/18 0834 98.4 F (36.9 C)     Temp Source 05/05/18 0834 Oral     SpO2 05/05/18 0834 98 %     Weight 05/05/18 0835 161 lb (73 kg)     Height 05/05/18 0835 5\' 5"  (1.651 m)     Head Circumference --      Peak Flow --      Pain Score 05/05/18 0837 10     Pain Loc --      Pain Edu? --      Excl. in [place]? --    Constitutional: Alert and oriented. Well appearing and in no acute distress.  Patient is fearful, crying, distraught. Eyes: Conjunctivae are normal.  Head: Atraumatic. Neck: No stridor.   Cardiovascular: Normal rate, regular rhythm. Grossly normal heart sounds.  Good peripheral circulation. Respiratory: Normal respiratory effort.  No retractions. Lungs CTAB. Gastrointestinal: Soft and nontender. No distention.  Genitourinary: External genitalia is unremarkable on exam. Musculoskeletal: No lower extremity tenderness nor edema.   Neurologic:  Normal speech and language. No gross focal neurologic deficits are appreciated. Skin:  Skin is warm, dry and intact.  There is approximately a 2.5 cm erythematous area that is extremely tender to palpation on the proximal  aspect of the posterior thigh.  This is near the crease of the junction of the thigh and buttocks.  Patient is unable to tolerate a full exam because of pain and her  fearfulness Psychiatric: Mood and affect are normal. Speech and behavior are normal.  ____________________________________________   LABS (all labs ordered are listed, but only abnormal results are displayed)  Labs Reviewed - No data to display  PROCEDURES  Procedure(s) performed:   Marland KitchenMarland KitchenIncision and Drainage Date/Time: 05/05/2018 4:00 PM Performed  by: Johnn Hai, PA-C Authorized by: Johnn Hai, PA-C   Consent:    Consent obtained:  Verbal   Consent given by:  Patient and spouse   Risks discussed:  Pain and incomplete drainage Location:    Type:  Abscess   Location:  Lower extremity   Lower extremity location:  Buttock   Buttock location:  R buttock Pre-procedure details:    Skin preparation:  Antiseptic wash Procedure type:    Complexity:  Simple Procedure details:    Incision types:  Single straight   Incision depth:  Subcutaneous   Scalpel blade:  11   Wound management:  Probed and deloculated   Drainage:  Purulent   Drainage amount:  Moderate   Wound treatment:  Drain placed   Packing materials:  1/2 in iodoform gauze Post-procedure details:    Patient tolerance of procedure:  Tolerated well, no immediate complications    Critical Care performed: No  ____________________________________________   INITIAL IMPRESSION / ASSESSMENT AND PLAN / ED COURSE  As part of my medical decision making, I reviewed the following data within the electronic MEDICAL RECORD NUMBER Notes from prior ED visits and [place] Controlled Substance Database  Patient was given Percocet prior to procedure and did well in comparison to her initial assessment.  Patient was made aware that she would need to return in 2 days or see her PCP for drain removal if it has not already fallen out.  Patient was started on Bactrim DS twice daily for 10 days along with Norco as needed for pain.  Patient will return sooner if any worsening of her symptoms.  ____________________________________________   FINAL CLINICAL IMPRESSION(S) / ED DIAGNOSES  Final diagnoses:  Abscess of buttock, right     ED Discharge Orders        Ordered    sulfamethoxazole-trimethoprim (BACTRIM DS,SEPTRA DS) 800-160 MG tablet  2 times daily     05/05/18 1115    HYDROcodone-acetaminophen (NORCO/VICODIN) 5-325 MG tablet  Every 4 hours PRN     05/05/18 1115        Note:  This document was prepared using Dragon voice recognition software and may include unintentional dictation errors.    Johnn Hai, PA-C 05/05/18 1603    Harvest Dark, MD 05/07/18 [phone]

## 2018-05-05 NOTE — Discharge Instructions (Addendum)
Follow-up with your primary care doctor or return to the emergency department in 2 days for packing removal.  Begin taking antibiotics as directed twice a day for the next 10 days and Norco for pain as needed.  Do not drive or operate machinery while taking Norco.  After packing is out you may use warm moist compresses to the area.

## 2018-05-05 NOTE — ED Notes (Signed)
See triage note  Presents with possible abscess area to right buttock/groin area  States developed area last week  Having increased pain this am

## 2018-05-05 NOTE — ED Triage Notes (Signed)
Pt reports abscess to vaginal area since last week getting worse. Pt states Internet said to place warm compress and let burst on its own but pt reports she has and it has gotten more painful.

## 2018-11-05 ENCOUNTER — Encounter: Payer: Self-pay | Admitting: Emergency Medicine

## 2018-11-05 ENCOUNTER — Emergency Department
Admission: EM | Admit: 2018-11-05 | Discharge: 2018-11-05 | Disposition: A | Discharge status: Home / Self Care | Payer: Self-pay | Attending: Emergency Medicine | Admitting: Emergency Medicine

## 2018-11-05 ENCOUNTER — Other Ambulatory Visit: Payer: Self-pay

## 2018-11-05 DIAGNOSIS — N390 Urinary tract infection, site not specified: Secondary | ICD-10-CM | POA: Insufficient documentation

## 2018-11-05 DIAGNOSIS — Z87891 Personal history of nicotine dependence: Secondary | ICD-10-CM | POA: Insufficient documentation

## 2018-11-05 LAB — URINALYSIS, COMPLETE (UACMP) WITH MICROSCOPIC
BACTERIA UA: NONE SEEN
Bilirubin Urine: NEGATIVE
Glucose, UA: NEGATIVE mg/dL
Ketones, ur: NEGATIVE mg/dL
Nitrite: POSITIVE — AB
PH: 7 (ref 5.0–8.0)
PROTEIN: NEGATIVE mg/dL
Specific Gravity, Urine: 1.012 (ref 1.005–1.030)

## 2018-11-05 LAB — POCT PREGNANCY, URINE: Preg Test, Ur: NEGATIVE

## 2018-11-05 MED ORDER — NITROFURANTOIN MONOHYD MACRO 100 MG PO CAPS: 100.0000 mg | ORAL_CAPSULE | Freq: Two times a day (BID) | ORAL | 0 refills | Status: AC

## 2018-11-05 MED ORDER — PHENAZOPYRIDINE HCL 200 MG PO TABS: 200.0000 mg | ORAL_TABLET | Freq: Three times a day (TID) | ORAL | 0 refills | Status: AC | PRN

## 2018-11-05 NOTE — ED Provider Notes (Signed)
[facility] Emergency Department Provider Note  ____________________________________________   First MD Initiated Contact with Patient 11/05/18 1426     (approximate)  I have reviewed the triage vital signs and the nursing notes.   HISTORY  Chief Complaint Hematuria    HPI LYNNETT LANGLINAIS is a 33 y.o. female presents emergency department complaining of UTI symptoms.  She states she has had burning with urination and pain.  She also states she has had some hesitancy.  She denies any fever, chills, vomiting.  She states she was seen at the health department for a yearly physical on Friday.  They placed her on Flagyl for a bacterial vaginosis infection.  Chlamydia and gonorrhea test are pending from the health department.    History reviewed. No pertinent past medical history.  There are no active problems to display for this patient.   History reviewed. No pertinent surgical history.  Prior to Admission medications   Medication Sig Start Date End Date Taking? Authorizing Provider  nitrofurantoin, macrocrystal-monohydrate, (MACROBID) 100 MG capsule Take 1 capsule (100 mg total) by mouth 2 (two) times daily. 11/05/18   Florentine Diekman, Linden Dolin, PA-C  phenazopyridine (PYRIDIUM) 200 MG tablet Take 1 tablet (200 mg total) by mouth 3 (three) times daily as needed for pain. 11/05/18   Versie Starks, PA-C    Allergies Amoxicillin  History reviewed. No pertinent family history.  Social History Social History   Tobacco Use  . Smoking status: Former Smoker    Packs/day: 0.50    Types: Cigarettes    Last attempt to quit: 10/31/2017    Years since quitting: 1.0  . Smokeless tobacco: Never Used  Substance Use Topics  . Alcohol use: No  . Drug use: Not on file    Review of Systems  Constitutional: No fever/chills Eyes: No visual changes. ENT: No sore throat. Respiratory: Denies cough Genitourinary: Positive for dysuria. Musculoskeletal: Negative for back  pain. Skin: Negative for rash.    ____________________________________________   PHYSICAL EXAM:  VITAL SIGNS: ED Triage Vitals  Enc Vitals Group     BP 11/05/18 1402 126/72     Pulse Rate 11/05/18 1402 76     Resp 11/05/18 1402 18     Temp 11/05/18 1402 98.2 F (36.8 C)     Temp src --      SpO2 11/05/18 1402 98 %     Weight 11/05/18 1407 165 lb (74.8 kg)     Height 11/05/18 1407 5\' 5"  (1.651 m)     Head Circumference --      Peak Flow --      Pain Score 11/05/18 1406 5     Pain Loc --      Pain Edu? --      Excl. in [place]? --     Constitutional: Alert and oriented. Well appearing and in no acute distress. Eyes: Conjunctivae are normal.  Head: Atraumatic. Nose: No congestion/rhinnorhea. Mouth/Throat: Mucous membranes are moist.   Neck:  supple no lymphadenopathy noted Cardiovascular: Normal rate, regular rhythm. Heart sounds are normal Respiratory: Normal respiratory effort.  No retractions, lungs c t a  Abd: soft nontender bs normal all 4 quad, no CVA tenderness is noted GU: deferred Musculoskeletal: FROM all extremities, warm and well perfused Neurologic:  Normal speech and language.  Skin:  Skin is warm, dry and intact. No rash noted. Psychiatric: Mood and affect are normal. Speech and behavior are normal.  ____________________________________________   LABS (all labs ordered are  listed, but only abnormal results are displayed)  Labs Reviewed  URINALYSIS, COMPLETE (UACMP) WITH MICROSCOPIC - Abnormal; Notable for the following components:      Result Value   Color, Urine AMBER (*)    APPearance CLEAR (*)    Hgb urine dipstick MODERATE (*)    Nitrite POSITIVE (*)    Leukocytes, UA TRACE (*)    All other components within normal limits  URINE CULTURE  POC URINE PREG, ED  POCT PREGNANCY, URINE    ____________________________________________   ____________________________________________  RADIOLOGY    ____________________________________________   PROCEDURES  Procedure(s) performed: No  Procedures    ____________________________________________   INITIAL IMPRESSION / ASSESSMENT AND PLAN / ED COURSE  Pertinent labs & imaging results that were available during my care of the patient were reviewed by me and considered in my medical decision making (see chart for details).   Patient is a 33 year old female presents emergency department complaint of UTI symptoms.  On physical exam patient appears well.  The UA is positive for white blood cell clumps and nitrites  Explained the findings to the patient.  She is to continue the Flagyl she was provided at the health department.  She is given a prescription for Macrobid and Pyridium.  She is to drink plenty of water.  Follow-up with health department.  She states she understands will comply.  She was discharged in stable condition.     As part of my medical decision making, I reviewed the following data within the [place] notes reviewed and incorporated, Labs reviewed POC pregnant negative, UA positive for WBC clumps, trace leuks, and nitrites, Old chart reviewed, Notes from prior ED visits and [place] Controlled Substance Database  ____________________________________________   FINAL CLINICAL IMPRESSION(S) / ED DIAGNOSES  Final diagnoses:  Acute UTI      NEW MEDICATIONS STARTED DURING THIS VISIT:  New Prescriptions   NITROFURANTOIN, MACROCRYSTAL-MONOHYDRATE, (MACROBID) 100 MG CAPSULE    Take 1 capsule (100 mg total) by mouth 2 (two) times daily.   PHENAZOPYRIDINE (PYRIDIUM) 200 MG TABLET    Take 1 tablet (200 mg total) by mouth 3 (three) times daily as needed for pain.     Note:  This document was prepared using Dragon voice recognition software and may include unintentional dictation  errors.    Versie Starks, PA-C 11/05/18 [place], [place], MD 11/11/18 1341

## 2018-11-05 NOTE — Discharge Instructions (Addendum)
Follow-up with your regular doctor as needed.  If you are worsening please return emergency department.  Use medication as prescribed.  Drink plenty of water to help flush out your system.

## 2018-11-05 NOTE — ED Triage Notes (Signed)
Pt presents with pain and burning during urination since last night. States she was recently seen at health department and was given flagyl for "infection" and that it has gotten worse. She c/o painful urination, frequency, retention. Pt alert & oriented with NAD noted.

## 2018-11-08 LAB — URINE CULTURE

## 2019-01-14 ENCOUNTER — Encounter: Payer: Self-pay | Admitting: Emergency Medicine

## 2019-01-14 ENCOUNTER — Emergency Department: Payer: Self-pay

## 2019-01-14 ENCOUNTER — Emergency Department
Admission: EM | Admit: 2019-01-14 | Discharge: 2019-01-14 | Disposition: A | Discharge status: Home / Self Care | Payer: Self-pay | Attending: Emergency Medicine | Admitting: Emergency Medicine

## 2019-01-14 DIAGNOSIS — R609 Edema, unspecified: Secondary | ICD-10-CM | POA: Insufficient documentation

## 2019-01-14 DIAGNOSIS — R011 Cardiac murmur, unspecified: Secondary | ICD-10-CM | POA: Insufficient documentation

## 2019-01-14 DIAGNOSIS — R0789 Other chest pain: Secondary | ICD-10-CM | POA: Insufficient documentation

## 2019-01-14 DIAGNOSIS — Z87891 Personal history of nicotine dependence: Secondary | ICD-10-CM | POA: Insufficient documentation

## 2019-01-14 HISTORY — DX: Cardiac murmur, unspecified: R01.1

## 2019-01-14 LAB — CBC
HCT: 36.2 % (ref 36.0–46.0)
HEMOGLOBIN: 12 g/dL (ref 12.0–15.0)
MCH: 28 pg (ref 26.0–34.0)
MCHC: 33.1 g/dL (ref 30.0–36.0)
MCV: 84.4 fL (ref 80.0–100.0)
PLATELETS: 288 10*3/uL (ref 150–400)
RBC: 4.29 MIL/uL (ref 3.87–5.11)
RDW: 13 % (ref 11.5–15.5)
WBC: 10.6 10*3/uL — AB (ref 4.0–10.5)
nRBC: 0 % (ref 0.0–0.2)

## 2019-01-14 LAB — COMPREHENSIVE METABOLIC PANEL
ALBUMIN: 4.2 g/dL (ref 3.5–5.0)
ALK PHOS: 47 U/L (ref 38–126)
ALT: 17 U/L (ref 0–44)
ANION GAP: 9 (ref 5–15)
AST: 18 U/L (ref 15–41)
BUN: 16 mg/dL (ref 6–20)
CALCIUM: 9 mg/dL (ref 8.9–10.3)
CHLORIDE: 105 mmol/L (ref 98–111)
CO2: 23 mmol/L (ref 22–32)
Creatinine, Ser: 0.67 mg/dL (ref 0.44–1.00)
GFR calc non Af Amer: >60 mL/min (ref >60)
GLUCOSE: 92 mg/dL (ref 70–99)
POTASSIUM: 3.6 mmol/L (ref 3.5–5.1)
SODIUM: 137 mmol/L (ref 135–145)
Total Bilirubin: 0.5 mg/dL (ref 0.3–1.2)
Total Protein: 7.1 g/dL (ref 6.5–8.1)

## 2019-01-14 LAB — POCT PREGNANCY, URINE: Preg Test, Ur: NEGATIVE

## 2019-01-14 LAB — TROPONIN I

## 2019-01-14 MED ORDER — MEDICAL COMPRESSION STOCKINGS MISC: 0 refills | Status: AC

## 2019-01-14 NOTE — ED Provider Notes (Signed)
[facility] Emergency Department Provider Note  Time seen: 5:20 PM  I have reviewed the triage vital signs and the nursing notes.   HISTORY  Chief Complaint No chief complaint on file.    HPI Rebecca Blanchard is a 34 y.o. female with no significant past medical history presents to the emergency department for lower extremity edema.  According to the patient for the past 2 to 3 days she has noticed lower extremity edema, left greater than right.  States she typically notices it when she wears socks, she can see the rings that the socks leave on her feet.  Patient states she has had some mild chest discomfort described more as breast pain over the past several days as well.  Denies any shortness of breath.  No history of lower extreme edema in the past.  Last menstrual period was last month.  Denies any trauma.  Denies any pain or tenderness to the legs.  No erythema of the legs.   Past Medical History:  Diagnosis Date  . Heart murmur     There are no active problems to display for this patient.   History reviewed. No pertinent surgical history.  Prior to Admission medications   Medication Sig Start Date End Date Taking? Authorizing Provider  nitrofurantoin, macrocrystal-monohydrate, (MACROBID) 100 MG capsule Take 1 capsule (100 mg total) by mouth 2 (two) times daily. 11/05/18   Fisher, Linden Dolin, PA-C  phenazopyridine (PYRIDIUM) 200 MG tablet Take 1 tablet (200 mg total) by mouth 3 (three) times daily as needed for pain. 11/05/18   Versie Starks, PA-C    Allergies  Allergen Reactions  . Amoxicillin Rash    Patient states she "drank the whole bottle" of her amoxicillin prescription as a child, caused rash    No family history on file.  Social History Social History   Tobacco Use  . Smoking status: Former Smoker    Packs/day: 0.50    Types: Cigarettes    Last attempt to quit: 10/31/2017    Years since quitting: 1.2  . Smokeless tobacco: Never Used   Substance Use Topics  . Alcohol use: No  . Drug use: Not on file    Review of Systems Constitutional: Negative for fever. Cardiovascular: States mild bilateral breasts tenderness over the past several days.  Denies any "chest pain." Respiratory: Negative for shortness of breath. Gastrointestinal: Negative for abdominal pain Musculoskeletal: Bilateral lower extremity swelling over the past 2 to 3 days left greater than right.  No pain. Skin: Negative for skin complaints.  Denies any redness of the lower extremities. Neurological: Negative for headache All other ROS negative  ____________________________________________   PHYSICAL EXAM:  VITAL SIGNS: ED Triage Vitals  Enc Vitals Group     BP 01/14/19 1629 129/65     Pulse Rate 01/14/19 1629 77     Resp --      Temp 01/14/19 1629 98.2 F (36.8 C)     Temp Source 01/14/19 1629 Oral     SpO2 01/14/19 1629 100 %     Weight 01/14/19 1629 170 lb (77.1 kg)     Height 01/14/19 1629 5\' 5"  (1.651 m)     Head Circumference --      Peak Flow --      Pain Score 01/14/19 1631 2     Pain Loc --      Pain Edu? --      Excl. in [place]? --    Constitutional: Alert and  oriented. Well appearing and in no distress. Eyes: Normal exam ENT   Head: Normocephalic and atraumatic.   Mouth/Throat: Mucous membranes are moist. Cardiovascular: Normal rate, regular rhythm.  2/6 murmur.  Patient is aware of murmur. Respiratory: Normal respiratory effort without tachypnea nor retractions. Breath sounds are clear Gastrointestinal: Soft and nontender. No distention. Musculoskeletal: Nontender with normal range of motion in all extremities.  Minimal lower extremity edema, mostly appreciated in the left leg, no edema appreciated in the right leg.  No calf tenderness bilaterally.  2+ DP pulses bilaterally. Neurologic:  Normal speech and language. No gross focal neurologic deficits Skin:  Skin is warm, dry and intact.  Psychiatric: Mood and affect are  normal.   ____________________________________________    EKG  EKG viewed and interpreted by myself shows a normal sinus rhythm at 72 bpm with a narrow QRS, normal axis, normal intervals, no concerning ST changes.  ____________________________________________    RADIOLOGY  X-rays negative. Ultrasound was negative  ____________________________________________   INITIAL IMPRESSION / ASSESSMENT AND PLAN / ED COURSE  Pertinent labs & imaging results that were available during my care of the patient were reviewed by me and considered in my medical decision making (see chart for details).  Department with concerns over left leg swelling, states both of her legs appear to have been swollen over the past 2 to 3 days.  Very minimal edema currently.  Differential this time would include cardiomyopathy, CHF, DVT, peripheral edema.  We will check labs including cardiac enzymes, EKG and ultrasound of the left lower extremity.  X-ray was performed in triage which is negative.  Patient's lab work is reassuring.  Troponin negative.  Ultrasound of the leg is negative, x-ray negative.  Overall the patient appears very well.  We will discharge with compression stockings and PCP follow-up.  Patient agreeable to plan of care.  ____________________________________________   FINAL CLINICAL IMPRESSION(S) / ED DIAGNOSES  Peripheral edema   Harvest Dark, MD 01/14/19 [phone]

## 2019-01-14 NOTE — Discharge Instructions (Addendum)
Please wear compression stockings while awake.  You may remove at night but please keep legs elevated to help reduce swelling.  Your work-up today has shown largely normal results.  Please follow-up with your primary care doctor in the next 2 to 3 days for recheck/reevaluation.  Return to the emergency department if you develop any chest pain, trouble breathing, or any other symptom personally concerning to yourself.

## 2019-01-14 NOTE — ED Triage Notes (Addendum)
C/o retaining fluid in ankles x 2 days.  States swelling does not improve overnight.  C/O feeling like legs are restless and starting to c/o tingling sensation to left lower leg and foot.  Patient is AAOx3.  Skin warm and dry. NAD

## 2019-01-14 NOTE — ED Notes (Signed)
AAOx3.  Skin warm and dry.  NAD 

## 2020-05-24 ENCOUNTER — Other Ambulatory Visit: Payer: Self-pay

## 2020-05-24 ENCOUNTER — Encounter: Payer: Self-pay | Admitting: Podiatry

## 2020-05-24 ENCOUNTER — Ambulatory Visit: Payer: Medicaid Other | Admitting: Podiatry

## 2020-05-24 DIAGNOSIS — D492 Neoplasm of unspecified behavior of bone, soft tissue, and skin: Secondary | ICD-10-CM | POA: Diagnosis not present

## 2020-05-24 DIAGNOSIS — B070 Plantar wart: Secondary | ICD-10-CM

## 2020-05-26 NOTE — Progress Notes (Signed)
   Subjective: 35 y.o. female presenting today as a new patient with a chief complaint of a painful lesion noted to the plantar right foot that appeared about three months ago. She reports associated hard skin and discoloration. She states touching the area and walking increases the pain. She has not had any treatment for the symptoms. Patient is here for further evaluation and treatment.    Past Medical History:  Diagnosis Date  . Heart murmur     Objective: Physical Exam General: The patient is alert and oriented x3 in no acute distress.   Dermatology: Hyperkeratotic skin lesion(s) noted to the plantar aspect of the right foot approximately 1 cm in diameter. Pinpoint bleeding noted upon debridement. Skin is warm, dry and supple bilateral lower extremities. Negative for open lesions or macerations.   Vascular: Palpable pedal pulses bilaterally. No edema or erythema noted. Capillary refill within normal limits.   Neurological: Epicritic and protective threshold grossly intact bilaterally.    Musculoskeletal Exam: Pain on palpation to the noted skin lesion(s).  Range of motion within normal limits to all pedal and ankle joints bilateral. Muscle strength 5/5 in all groups bilateral.    Assessment: #1 plantar wart right foot #2 pain in right foot     Plan of Care:  #1 Patient was evaluated. #2 Excisional debridement of the plantar wart lesion(s) was performed using a chisel blade. Cantharone was applied and the lesion(s) was dressed with a dry sterile dressing. #3 patient is to return to clinic in 2 weeks.  Works at [employer].      Edrick Kins, DPM [facility]  Dr. Edrick Kins, [address]                                        [place], [place] [address]                Office [phone]  Fax [phone]

## 2020-06-07 ENCOUNTER — Other Ambulatory Visit: Payer: Self-pay

## 2020-06-07 ENCOUNTER — Ambulatory Visit: Payer: Medicaid Other | Admitting: Podiatry

## 2020-06-07 ENCOUNTER — Encounter: Payer: Self-pay | Admitting: Podiatry

## 2020-06-07 DIAGNOSIS — D492 Neoplasm of unspecified behavior of bone, soft tissue, and skin: Secondary | ICD-10-CM | POA: Diagnosis not present

## 2020-06-07 DIAGNOSIS — B070 Plantar wart: Secondary | ICD-10-CM

## 2020-06-07 NOTE — Progress Notes (Signed)
   Subjective: Patient presents today for follow-up evaluation of a plantar wart to the right lower extremity. Patient presents today for follow-up treatment and evaluation   Objective: Physical Exam General: The patient is alert and oriented x3 in no acute distress.   Dermatology: Hyperkeratotic skin lesion noted to the plantar aspect of the right foot great toe approximately 1 cm in diameter. Pinpoint bleeding noted upon debridement. Skin is warm, dry and supple bilateral lower extremities. Negative for open lesions or macerations.   Vascular: Palpable pedal pulses bilaterally. No edema or erythema noted. Capillary refill within normal limits.   Neurological: Epicritic and protective threshold grossly intact bilaterally.    Musculoskeletal Exam: Pain on palpation to the noted skin lesion.  Range of motion within normal limits to all pedal and ankle joints bilateral. Muscle strength 5/5 in all groups bilateral.    Assessment: #1 plantar wart right foot #2 pain in right foot     Plan of Care:  #1 Patient was evaluated. #2 Excisional debridement of the plantar wart lesion was performed using a chisel blade. Salicylic acid was applied and the lesion was dressed with a dry sterile dressing. #3 recommend OTC wart remover x 2 weeks daily #4 return to clinic PRN     Edrick Kins, DPM [facility]  Dr. Edrick Kins, [address]                                        [place], [place] [address]                Office [phone]  Fax [phone]

## 2021-08-25 ENCOUNTER — Emergency Department: Payer: Medicaid Other

## 2021-08-25 ENCOUNTER — Other Ambulatory Visit: Payer: Self-pay

## 2021-08-25 ENCOUNTER — Emergency Department
Admission: EM | Admit: 2021-08-25 | Discharge: 2021-08-25 | Disposition: A | Discharge status: Home / Self Care | Payer: Medicaid Other | Attending: Emergency Medicine | Admitting: Emergency Medicine

## 2021-08-25 DIAGNOSIS — M25551 Pain in right hip: Secondary | ICD-10-CM | POA: Diagnosis not present

## 2021-08-25 DIAGNOSIS — Z87891 Personal history of nicotine dependence: Secondary | ICD-10-CM | POA: Diagnosis not present

## 2021-08-25 DIAGNOSIS — X501XXA Overexertion from prolonged static or awkward postures, initial encounter: Secondary | ICD-10-CM | POA: Diagnosis not present

## 2021-08-25 DIAGNOSIS — S73191A Other sprain of right hip, initial encounter: Secondary | ICD-10-CM | POA: Diagnosis not present

## 2021-08-25 DIAGNOSIS — S79911A Unspecified injury of right hip, initial encounter: Secondary | ICD-10-CM | POA: Diagnosis present

## 2021-08-25 LAB — POC URINE PREG, ED: Preg Test, Ur: NEGATIVE

## 2021-08-25 MED ORDER — ONDANSETRON HCL 4 MG/2ML IJ SOLN
4.0000 mg | Freq: Once | INTRAMUSCULAR | Status: AC
  Administered 2021-08-25: 4 mg via INTRAVENOUS
  Filled 2021-08-25: qty 2

## 2021-08-25 MED ORDER — FENTANYL CITRATE (PF) 100 MCG/2ML IJ SOLN
50.0000 ug | Freq: Once | INTRAMUSCULAR | Status: AC
  Administered 2021-08-25: 50 ug via INTRAVENOUS
  Filled 2021-08-25: qty 2

## 2021-08-25 MED ORDER — PREDNISONE 10 MG (21) PO TBPK: ORAL_TABLET | ORAL | 0 refills | Status: AC

## 2021-08-25 MED ORDER — MELOXICAM 15 MG PO TABS: 15.0000 mg | ORAL_TABLET | Freq: Every day | ORAL | 2 refills | Status: AC

## 2021-08-25 MED ORDER — HYDROCODONE-ACETAMINOPHEN 5-325 MG PO TABS: 1.0000 | ORAL_TABLET | Freq: Four times a day (QID) | ORAL | 0 refills | Status: AC | PRN

## 2021-08-25 MED ORDER — MORPHINE SULFATE (PF) 4 MG/ML IV SOLN
4.0000 mg | Freq: Once | INTRAVENOUS | Status: AC
  Administered 2021-08-25: 4 mg via INTRAVENOUS
  Filled 2021-08-25: qty 1

## 2021-08-25 NOTE — ED Provider Notes (Signed)
[facility] Emergency Department Provider Note  ____________________________________________   Event Date/Time   First MD Initiated Contact with Patient 08/25/21 0818     (approximate)  I have reviewed the triage vital signs and the nursing notes.   HISTORY  Chief Complaint Hip Pain    HPI Rebecca Blanchard is a 36 y.o. female presents emergency department with severe right hip pain.  Patient states it feels like it needs to pop.  States she cannot sit in certain positions.  She cannot move her leg without help.  No known injury.  Patient has started doing yoga couple of weeks ago and has been going to the gym.  No numbness or tingling  Past Medical History:  Diagnosis Date   Heart murmur     There are no problems to display for this patient.   History reviewed. No pertinent surgical history.  Prior to Admission medications   Medication Sig Start Date End Date Taking? Authorizing Provider  HYDROcodone-acetaminophen (NORCO/VICODIN) 5-325 MG tablet Take 1 tablet by mouth every 6 (six) hours as needed for moderate pain. 08/25/21  Yes Brenon Antosh, Linden Dolin, PA-C  meloxicam (MOBIC) 15 MG tablet Take 1 tablet (15 mg total) by mouth daily. 08/25/21 08/25/22 Yes Lorree Millar, Linden Dolin, PA-C  predniSONE (STERAPRED UNI-PAK 21 TAB) 10 MG (21) TBPK tablet Take 6 pills on day one then decrease by 1 pill each day 08/25/21  Yes Caryn Section Linden Dolin, PA-C  Elastic Bandages & Supports ([place]) Kinder Please provide 16mHg compression stockings 01/14/19   PHarvest Dark MD  nitrofurantoin, macrocrystal-monohydrate, (MACROBID) 100 MG capsule Take 1 capsule (100 mg total) by mouth 2 (two) times daily. 11/05/18   Bohden Dung, SLinden Dolin PA-C  phenazopyridine (PYRIDIUM) 200 MG tablet Take 1 tablet (200 mg total) by mouth 3 (three) times daily as needed for pain. 11/05/18   FVersie Starks PA-C    Allergies Amoxicillin  No family history on file.  Social History Social  History   Tobacco Use   Smoking status: Former    Packs/day: 0.50    Types: Cigarettes    Quit date: 10/31/2017    Years since quitting: 3.8   Smokeless tobacco: Never  Substance Use Topics   Alcohol use: No    Review of Systems  Constitutional: No fever/chills Eyes: No visual changes. ENT: No sore throat. Respiratory: Denies cough Cardiovascular: Denies chest pain Gastrointestinal: Denies abdominal pain Genitourinary: Negative for dysuria. Musculoskeletal: Negative for back pain.  Positive for right hip pain Skin: Negative for rash. Psychiatric: no mood changes,     ____________________________________________   PHYSICAL EXAM:  VITAL SIGNS: ED Triage Vitals  Enc Vitals Group     BP 08/25/21 0820 (!) 100/59     Pulse Rate 08/25/21 0820 66     Resp 08/25/21 0820 18     Temp 08/25/21 0820 98.1 F (36.7 C)     Temp Source 08/25/21 0820 Oral     SpO2 08/25/21 0820 100 %     Weight 08/25/21 0814 144 lb (65.3 kg)     Height 08/25/21 0814 '5\' 5"'$  (1.651 m)     Head Circumference --      Peak Flow --      Pain Score 08/25/21 0814 8     Pain Loc --      Pain Edu? --      Excl. in [place] --     Constitutional: Alert and oriented. Well appearing and in no acute distress.  Eyes: Conjunctivae are normal.  Head: Atraumatic. Nose: No congestion/rhinnorhea. Mouth/Throat: Mucous membranes are moist.   Neck:  supple no lymphadenopathy noted Cardiovascular: Normal rate, regular rhythm. Heart sounds are normal Respiratory: Normal respiratory effort.  No retractions, lungs c t a  GU: deferred Musculoskeletal: Decreased range of motion of the right hip, patient is unable to stand without difficulty, it is hard for her to get comfortable in any position, neurovascular is intact  neurologic:  Normal speech and language.  Skin:  Skin is warm, dry and intact. No rash noted. Psychiatric: Mood and affect are normal. Speech and behavior are  normal.  ____________________________________________   LABS (all labs ordered are listed, but only abnormal results are displayed)  Labs Reviewed  POC URINE PREG, ED - Normal   ____________________________________________   ____________________________________________  RADIOLOGY  X-ray of the right hip MRI of the right hip  ____________________________________________   PROCEDURES  Procedure(s) performed: Crutches   Procedures    ____________________________________________   INITIAL IMPRESSION / ASSESSMENT AND PLAN / ED COURSE  Pertinent labs & imaging results that were available during my care of the patient were reviewed by me and considered in my medical decision making (see chart for details).   The patient is a 36 year old female presents emergency department right hip pain.  See HPI.  Physical exam shows patient appears stable.  Patient appears to have either a dislocated hip or severe muscle spasm around the capsule, also concerns for septic joint  X-ray of the right hip  Patient was given fentanyl IV  X-ray of the right hip reviewed by me confirmed by radiology be negative for any acute abnormality  Patient did have some relief with fentanyl but still has reproduced pain with movement of the hip.  Due to the popping sensation have concerns that there is a tear or impingement syndrome. MRI of the right hip ordered  MRI of the right hip shows a superior anterior labral tear.  Joint effusion.  Consult orthopedics.  Dr. Sabra Heck recommends steroids, anti-inflammatories, nonweightbearing follow-up in the office with sportsman.  I did discuss findings with patient.  She is to be nonweightbearing for at least 1 week.  Then bear weight as tolerated.  She was placed on steroid pack, meloxicam, and hydrocodone.  She is to apply ice.  Use crutches.  Follow-up with orthopedics in approximately 1 week for recheck.  Return emergency department worsening     Rebecca Blanchard was evaluated in Emergency Department on 08/25/2021 for the symptoms described in the history of present illness. She was evaluated in the context of the global COVID-19 pandemic, which necessitated consideration that the patient might be at risk for infection with the SARS-CoV-2 virus that causes COVID-19. Institutional protocols and algorithms that pertain to the evaluation of patients at risk for COVID-19 are in a state of rapid change based on information released by regulatory bodies including the CDC and federal and state organizations. These policies and algorithms were followed during the patient's care in the ED.    As part of my medical decision making, I reviewed the following data within the [facility] notes reviewed and incorporated, Old chart reviewed, Radiograph reviewed , A consult was requested and obtained from this/these consultant(s) Orthopedics, Notes from prior ED visits, and [place] Controlled Substance Database  ____________________________________________   FINAL CLINICAL IMPRESSION(S) / ED DIAGNOSES  Final diagnoses:  Tear of right acetabular labrum, initial encounter      NEW MEDICATIONS STARTED DURING  THIS VISIT:  Discharge Medication List as of 08/25/2021 12:19 PM     START taking these medications   Details  HYDROcodone-acetaminophen (NORCO/VICODIN) 5-325 MG tablet Take 1 tablet by mouth every 6 (six) hours as needed for moderate pain., Starting Fri 08/25/2021, Normal    meloxicam (MOBIC) 15 MG tablet Take 1 tablet (15 mg total) by mouth daily., Starting Fri 08/25/2021, Until Sat 08/25/2022, Normal    predniSONE (STERAPRED UNI-PAK 21 TAB) 10 MG (21) TBPK tablet Take 6 pills on day one then decrease by 1 pill each day, Normal         Note:  This document was prepared using Dragon voice recognition software and may include unintentional dictation errors.    Versie Starks, PA-C 08/25/21 1326    Lavonia Drafts, MD 08/25/21  [phone]

## 2021-08-25 NOTE — Discharge Instructions (Addendum)
You have an anterior superior labral tearing with joint effusion Use crutches and do not bear weight until it is not severely painful to bear weight Take the pain medication as needed Apply ice to the hip Follow up with orthopedics, you may need physical therapy or possible surgery if you do not heal on your own

## 2021-08-25 NOTE — ED Triage Notes (Signed)
Pt c/o right hip pain, denies injury.states sometimes it will pop and fell better but after going to the gym thinking that would relieve it , states it has not gotten better.

## 2022-08-28 ENCOUNTER — Other Ambulatory Visit: Payer: Self-pay | Admitting: Nurse Practitioner

## 2022-08-28 DIAGNOSIS — N921 Excessive and frequent menstruation with irregular cycle: Secondary | ICD-10-CM

## 2022-09-05 ENCOUNTER — Ambulatory Visit
Admission: RE | Admit: 2022-09-05 | Discharge: 2022-09-05 | Disposition: A | Discharge status: Home / Self Care | Payer: Medicaid Other | Source: Ambulatory Visit | Attending: Nurse Practitioner | Admitting: Nurse Practitioner

## 2022-09-05 DIAGNOSIS — N921 Excessive and frequent menstruation with irregular cycle: Secondary | ICD-10-CM | POA: Insufficient documentation

## 2022-11-13 ENCOUNTER — Other Ambulatory Visit: Payer: Self-pay

## 2022-11-13 ENCOUNTER — Emergency Department
Admission: EM | Admit: 2022-11-13 | Discharge: 2022-11-13 | Disposition: A | Discharge status: Home / Self Care | Payer: Medicaid Other | Attending: Emergency Medicine | Admitting: Emergency Medicine

## 2022-11-13 ENCOUNTER — Encounter: Payer: Self-pay | Admitting: Emergency Medicine

## 2022-11-13 DIAGNOSIS — N644 Mastodynia: Secondary | ICD-10-CM | POA: Insufficient documentation

## 2022-11-13 NOTE — Discharge Instructions (Signed)
Please schedule an up meant with [facility] or [facility] to establish care.  These primary care facilities can then order an outpatient mammogram/breast ultrasound for you.

## 2022-11-13 NOTE — ED Triage Notes (Signed)
Patient to ED for lump in left breast. Patient states it is painful when anything touches. Also, possible lump on right breast. Has not seen PCP due to insurance getting cancelled.

## 2022-11-13 NOTE — ED Provider Notes (Signed)
[facility] Provider Note  Patient Contact: 7:06 PM (approximate)   History   Breast Mass   HPI  Rebecca Blanchard is a 37 y.o. female presents to the emergency department with concern for 2 possible breast masses.  Patient has palpated a 2 cm x 2 cm region at the 10 o'clock position of the left breast and a 1 cm x 1 cm region at the 7 o'clock position of the right breast.  Patient's had no erythema overlying either breast and has not had any discharge from the nipple.  No weight loss or night sweats.  Patient states that she recently lost her insurance and has been unable to seek care with her PCP.  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      Physical Exam   Triage Vital Signs: ED Triage Vitals [11/13/22 1745]  Enc Vitals Group     BP (!) 142/98     Pulse Rate 82     Resp 18     Temp 98.3 F (36.8 C)     Temp Source Oral     SpO2 99 %     Weight 140 lb (63.5 kg)     Height '5\' 5"'$  (1.651 m)     Head Circumference      Peak Flow      Pain Score 5     Pain Loc      Pain Edu?      Excl. in [place]?     Most recent vital signs: Vitals:   11/13/22 1745  BP: (!) 142/98  Pulse: 82  Resp: 18  Temp: 98.3 F (36.8 C)  SpO2: 99%     General: Alert and in no acute distress. Eyes:  PERRL. EOMI. Head: No acute traumatic findings ENT:      Nose: No congestion/rhinnorhea.      Mouth/Throat: Mucous membranes are moist. Neck: No stridor. No cervical spine tenderness to palpation. Cardiovascular:  Good peripheral perfusion Respiratory: Normal respiratory effort without tachypnea or retractions. Lungs CTAB. Good air entry to the bases with no decreased or absent breath sounds. Gastrointestinal: Bowel sounds 4 quadrants. Soft and nontender to palpation. No guarding or rigidity. No palpable masses. No distention. No CVA tenderness. Musculoskeletal: Full range of motion to all extremities.  Neurologic:  No gross focal neurologic deficits are  appreciated.  Skin: Patient has a 2 cm x 2 cm firm, mobile, mass palpated of the left breast at the 10 o'clock position and a 1 cm x 1 cm mobile, mass of the right breast at the 7 o'clock position. Other:   ED Results / Procedures / Treatments   Labs (all labs ordered are listed, but only abnormal results are displayed) Labs Reviewed - No data to display     PROCEDURES:  Critical Care performed: No  Procedures   MEDICATIONS ORDERED IN ED: Medications - No data to display   IMPRESSION / MDM / [employer] / ED COURSE  I reviewed the triage vital signs and the nursing notes.                              Assessment and plan Breast pain 37 year old female presents to the emergency department with bilateral breast pain.  Vital signs were reassuring at triage.  On exam, patient was alert, active and nontoxic-appearing.  See HPI and physical exam for further descriptions of breast lumps/masses identified during  this emergency department encounter.  I recommended seeking care with [facility] or [facility] at this time to order outpatient mammogram.  Return precautions were given to return with new or worsening symptoms.  All patient questions were answered.     FINAL CLINICAL IMPRESSION(S) / ED DIAGNOSES   Final diagnoses:  Breast pain     Rx / DC Orders   ED Discharge Orders     None        Note:  This document was prepared using Dragon voice recognition software and may include unintentional dictation errors.   Vallarie Mare [place], PA-C 11/13/22 1912    Rada Hay, MD 11/13/22 [phone]

## 2022-11-15 ENCOUNTER — Other Ambulatory Visit: Payer: Self-pay

## 2022-11-15 DIAGNOSIS — N6324 Unspecified lump in the left breast, lower inner quadrant: Secondary | ICD-10-CM

## 2022-11-15 DIAGNOSIS — N6322 Unspecified lump in the left breast, upper inner quadrant: Secondary | ICD-10-CM

## 2022-11-15 DIAGNOSIS — N644 Mastodynia: Secondary | ICD-10-CM

## 2022-11-19 ENCOUNTER — Ambulatory Visit: Payer: Medicaid Other | Attending: Hematology and Oncology | Admitting: Hematology and Oncology

## 2022-11-19 ENCOUNTER — Ambulatory Visit
Admission: RE | Admit: 2022-11-19 | Discharge: 2022-11-19 | Disposition: A | Discharge status: Home / Self Care | Payer: Self-pay | Source: Ambulatory Visit | Attending: Obstetrics and Gynecology | Admitting: Obstetrics and Gynecology

## 2022-11-19 VITALS — BP 119/64 | Wt 134.0 lb

## 2022-11-19 DIAGNOSIS — N6322 Unspecified lump in the left breast, upper inner quadrant: Secondary | ICD-10-CM | POA: Insufficient documentation

## 2022-11-19 DIAGNOSIS — N6324 Unspecified lump in the left breast, lower inner quadrant: Secondary | ICD-10-CM | POA: Insufficient documentation

## 2022-11-19 DIAGNOSIS — N644 Mastodynia: Secondary | ICD-10-CM

## 2022-11-19 NOTE — Patient Instructions (Signed)
Taught Rebecca Blanchard about self breast awareness. Patient did not need a Pap smear today due to last Pap smear was in 2021 per patient. Next Pap due 2026. Let her know [facility] will cover Pap smears every 5 years unless has a history of abnormal Pap smears. Referred patient to the [facility] for diagnostic mammogram. Appointment scheduled for 11/19/22. Patient aware of appointment and will be there. Let patient know will follow up with her within the next couple weeks with results. Rebecca Blanchard verbalized understanding.  Melodye Ped, NP 1:43 PM

## 2022-11-19 NOTE — Progress Notes (Signed)
Ms. BEYLA LONEY is a 37 y.o. female who presents to [facility] clinic today with complaint of left breast lump x 2 weeks, more painful in the last week.    Pap Smear: Pap not smear completed today. Last Pap smear was 2021 at [facility] clinic and was normal. Per patient has no history of an abnormal Pap smear. Last Pap smear result is not available in Epic.   Physical exam: Breasts Breasts symmetrical. No skin abnormalities bilateral breasts. No nipple retraction bilateral breasts. No nipple discharge bilateral breasts. No lymphadenopathy. No lumps palpated right breast. Lump noted at 7 o' clock, left breast.   Pelvic/Bimanual Pap is not indicated today    Smoking History: Patient has is a former smoker having quit in 2018 and was not referred to quit line.    Patient Navigation: Patient education provided. Access to services provided for patient through [facility] program. No interpreter provided. No transportation provided   Colorectal Cancer Screening: Per patient has never had colonoscopy completed No complaints today.    Breast and Cervical Cancer Risk Assessment: Patient has family history of breast cancer, maternal great aunt. Patient does not have history of cervical dysplasia, immunocompromised, or DES exposure in-utero.  Risk Scores as of 11/19/2022     Baker Janus           5-year 0.39 %   Lifetime 10 %            Last calculated by Demetrius Revel, LPN on 15/40/0867 at  1:31 PM        A: [facility] exam without pap smear Complaint of left breast lump, lower, inner quadrant. Palpable on exam. Also, with rash noted to upper torso and below left breast. She notes this is from tanning bed lotion.   P: [facility]. Appointment scheduled 11/19/22.  Melodye Ped, NP 11/19/2022 1:37 PM

## 2022-11-23 ENCOUNTER — Other Ambulatory Visit: Payer: Self-pay | Admitting: Obstetrics and Gynecology

## 2022-11-23 DIAGNOSIS — R928 Other abnormal and inconclusive findings on diagnostic imaging of breast: Secondary | ICD-10-CM

## 2022-11-24 ENCOUNTER — Other Ambulatory Visit: Payer: Self-pay | Admitting: Obstetrics and Gynecology

## 2022-12-04 ENCOUNTER — Other Ambulatory Visit: Payer: Self-pay

## 2022-12-10 ENCOUNTER — Other Ambulatory Visit: Payer: Self-pay

## 2022-12-10 DIAGNOSIS — N6324 Unspecified lump in the left breast, lower inner quadrant: Secondary | ICD-10-CM

## 2023-05-21 ENCOUNTER — Telehealth: Payer: Self-pay | Admitting: *Deleted

## 2023-06-09 ENCOUNTER — Other Ambulatory Visit: Payer: Self-pay | Admitting: Nurse Practitioner

## 2023-06-29 IMAGING — MR MR HIP*R* W/O CM
4 of 5 series · 26 of 40 positions shown · non-contrast
Comparison: Right hip radiograph 08/25/2021.

CLINICAL DATA: Radiograph 06/19/2021

EXAM:
MR OF THE RIGHT HIP WITHOUT CONTRAST
TECHNIQUE: Multiplanar, multisequence MR imaging was performed. No intravenous
contrast was administered.

[Series 3: T1 · coronal · right · 4.0mm · 0.59mm/px · 5 of 38 slices shown]
[im 1/38]
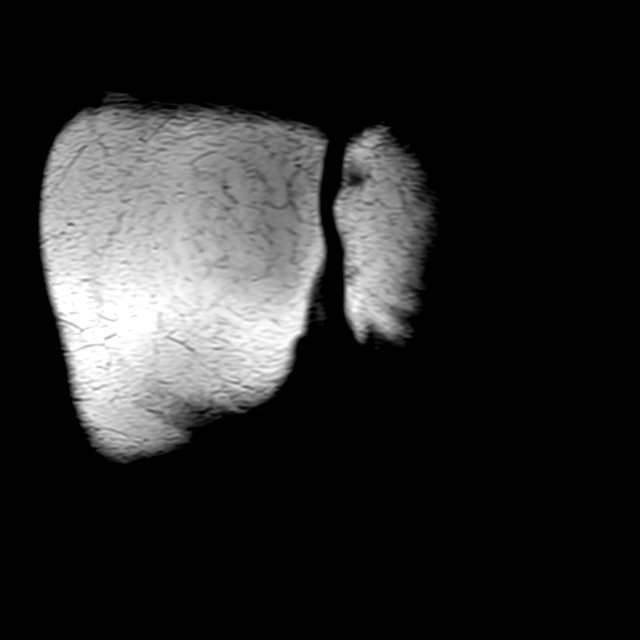
[im 5/38]
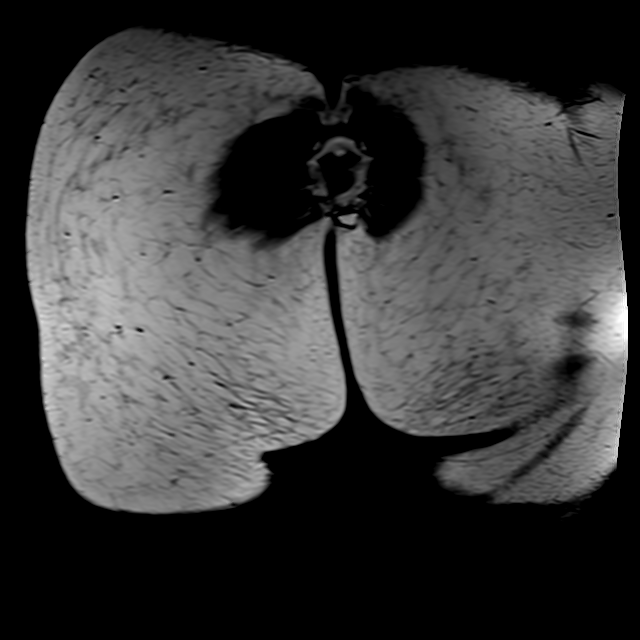
[im 13/38]
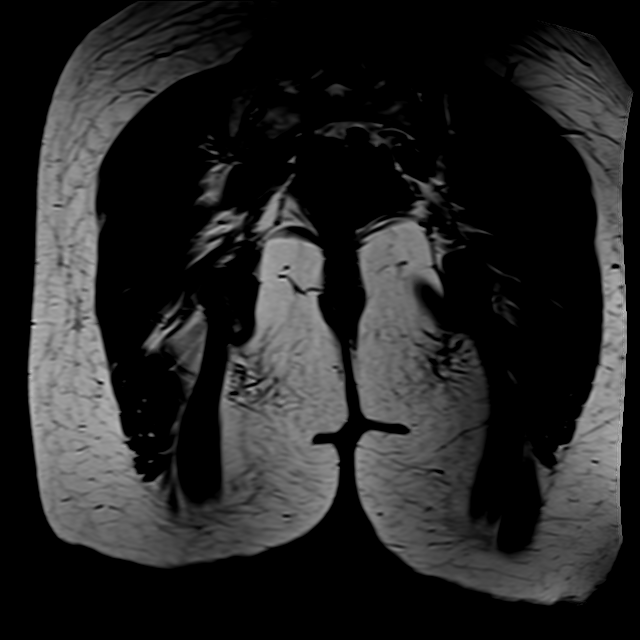
[im 21/38]
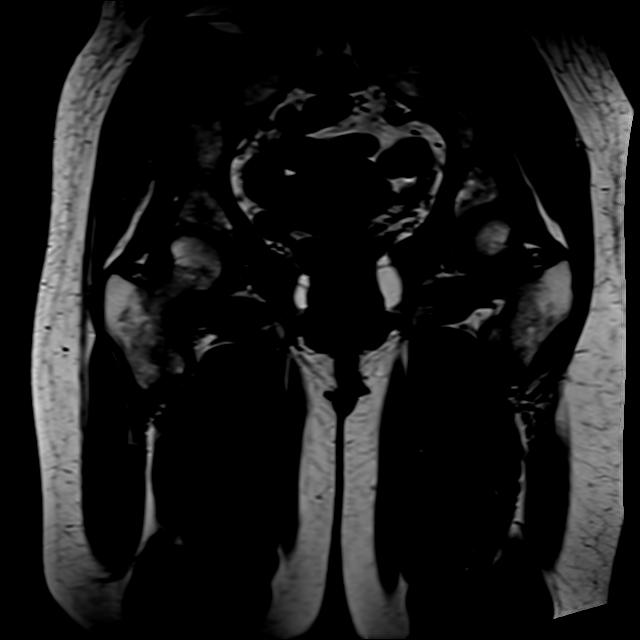
[im 33/38]
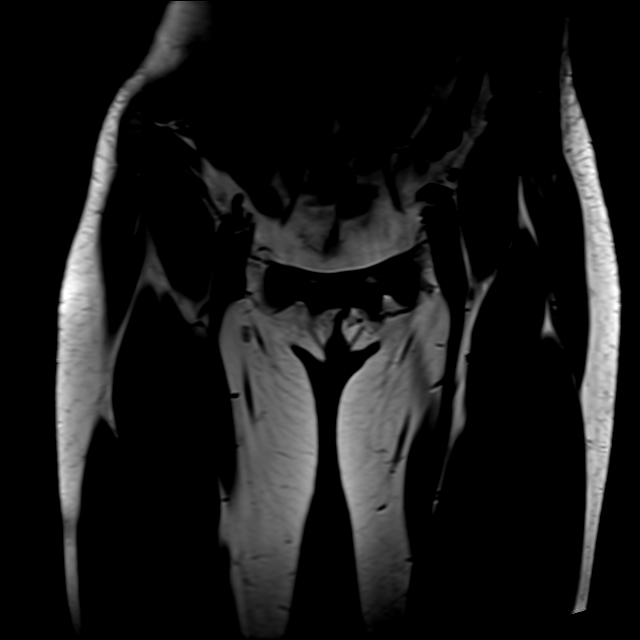

[Series 5: T2 fat-sat · axial · right · 4.0mm · 0.70mm/px · z∈[-138,+6]mm · 7 of 30 slices shown]
[im 1/30]
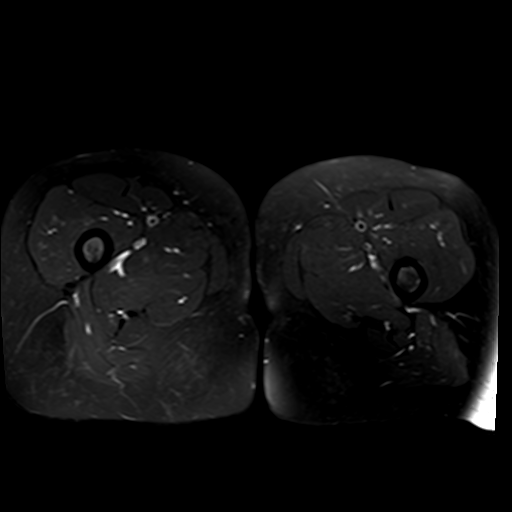
[im 5/30]
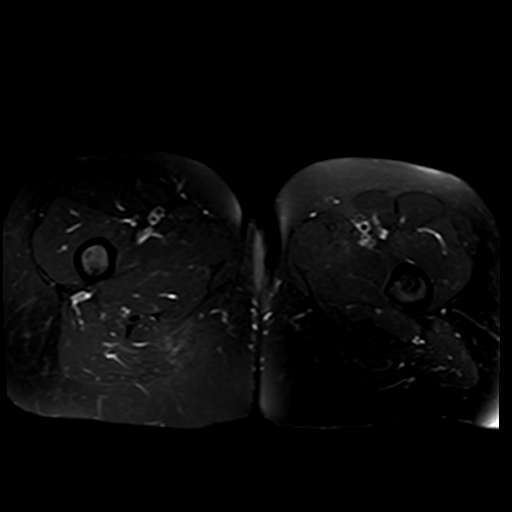
[im 10/30]
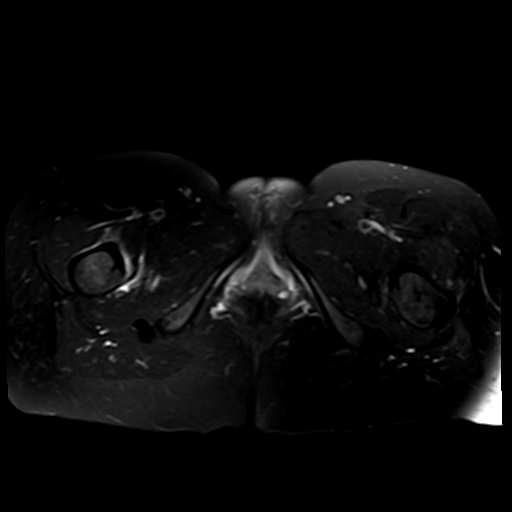
[im 15/30]
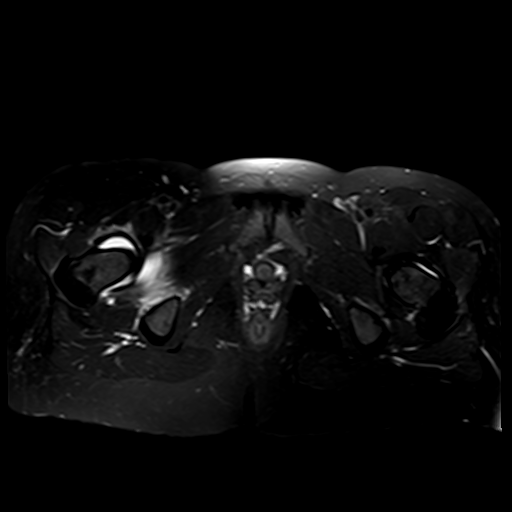
[im 20/30]
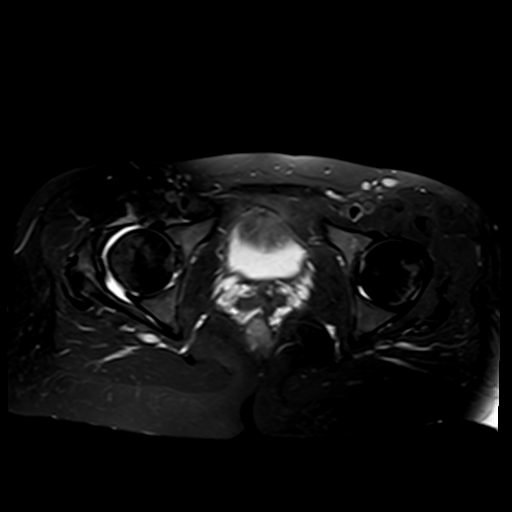
[im 25/30]
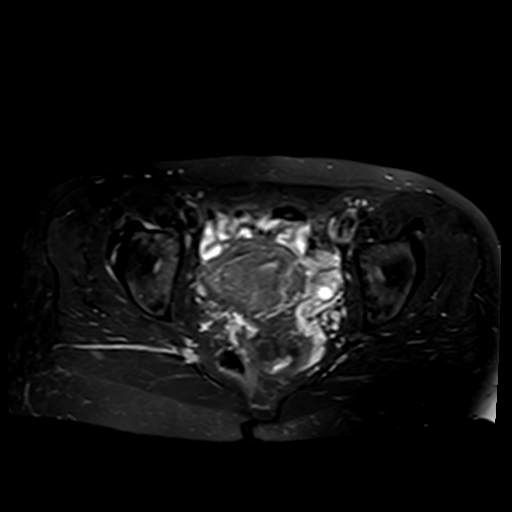
[im 30/30]
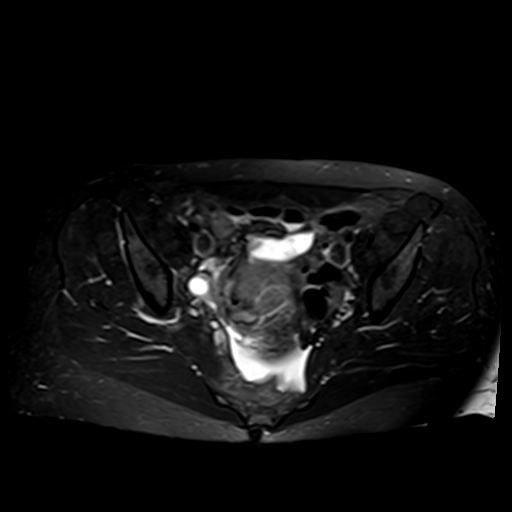

[Series 6: PD fat-sat · sagittal · right · 4.0mm · 0.78mm/px · 7 of 29 slices shown (1 of 2)]
[im 1/29]
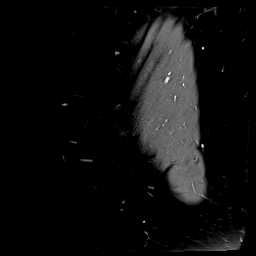
[im 5/29]
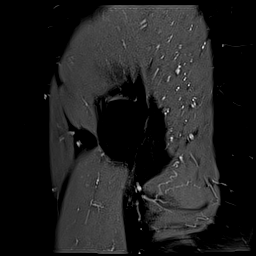
[im 10/29]
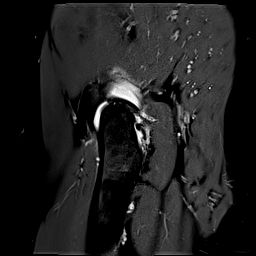
[im 15/29]
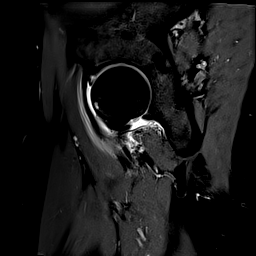
[im 19/29]
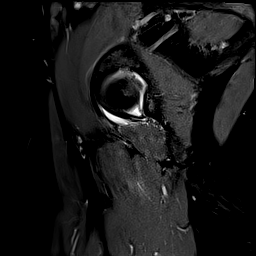
[im 24/29]
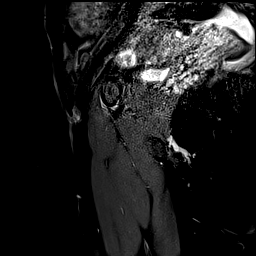
[im 29/29]
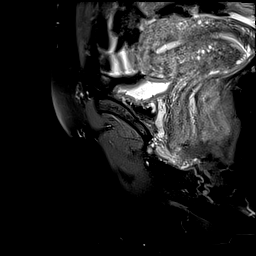

[Series 7: PD fat-sat · coronal · right · 4.0mm · 0.70mm/px · 7 of 29 slices shown (2 of 2)]
[im 1/29]
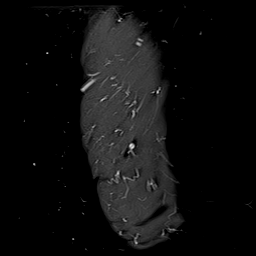
[im 5/29]
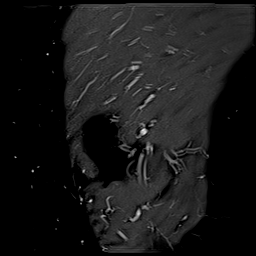
[im 10/29]
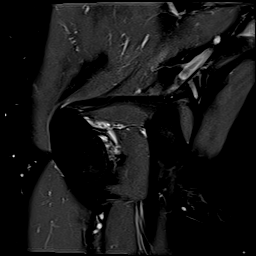
[im 15/29]
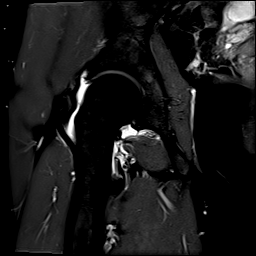
[im 19/29]
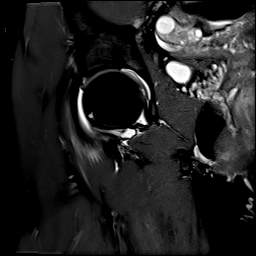
[im 24/29]
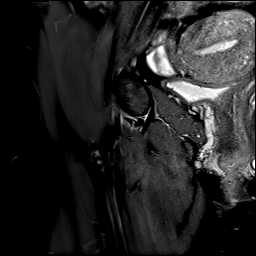
[im 29/29]
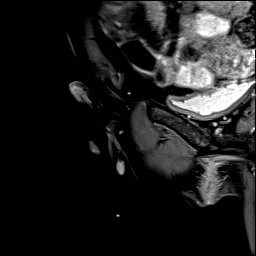

[26 of 40 positions shown; findings below may reference images not displayed]

FINDINGS: Bones: There is no evidence of acute fracture, dislocation or
avascular necrosis. Normal acetabular version. No focal bone lesion.
The visualized sacroiliac joints and symphysis pubis appear normal.

Articular cartilage and labrum

Articular cartilage:  No chondral defect.

Labrum: There is nondisplaced anterior superior labral tearing
within the substance of the labrum as well as at the chondrolabral
junction.

Joint or bursal effusion

Joint effusion: There is a moderate-sized right hip joint effusion.

Bursae: No evidence of trochanteric bursitis.

Muscles and tendons

Muscles and tendons: Mild insertional tendinopathy of the gluteal
tendons. No tendon tear. The proximal hamstrings are intact.The
adductors are intact. No muscle atrophy of edema.

Other findings

Miscellaneous: The visualized internal pelvic contents appear
unremarkable.
IMPRESSION: Nondisplaced anterior superior labral tearing. Moderate size right
hip joint effusion.

No significant hip arthritis.

## 2023-07-10 ENCOUNTER — Ambulatory Visit (LOCAL_COMMUNITY_HEALTH_CENTER): Payer: Medicaid Other

## 2023-07-10 VITALS — BP 107/59 | Ht 65.0 in | Wt 128.5 lb

## 2023-07-10 DIAGNOSIS — Z3202 Encounter for pregnancy test, result negative: Secondary | ICD-10-CM

## 2023-07-10 DIAGNOSIS — Z3009 Encounter for other general counseling and advice on contraception: Secondary | ICD-10-CM | POA: Diagnosis not present

## 2023-07-10 LAB — PREGNANCY, URINE: Preg Test, Ur: NEGATIVE

## 2023-07-10 NOTE — Progress Notes (Signed)
UPT negative. Negative preg packet given. Taking ocp but misses pill sometimes. LMP approx 06/26/2023. RN counseled on how to take ocp correctly. Per pt, thought she was pregnant because she felt movement in abd area. Has PCP, but lost health insurance coverage recently. Has hx heart murmur and has regular f-u. Local provider resource list given and reviewed along with [facility] services, including [facility]. Encouraged to f-u with PCP. Questions answered and reports understanding. Jerel Shepherd, RN

## 2023-08-06 ENCOUNTER — Other Ambulatory Visit: Payer: Self-pay | Admitting: Nurse Practitioner

## 2023-11-12 ENCOUNTER — Ambulatory Visit: Payer: Medicaid Other

## 2023-11-12 VITALS — BP 119/56 | Ht 65.0 in | Wt 127.5 lb

## 2023-11-12 DIAGNOSIS — Z3201 Encounter for pregnancy test, result positive: Secondary | ICD-10-CM | POA: Diagnosis not present

## 2023-11-12 DIAGNOSIS — Z3009 Encounter for other general counseling and advice on contraception: Secondary | ICD-10-CM

## 2023-11-12 LAB — PREGNANCY, URINE: Preg Test, Ur: POSITIVE — AB

## 2023-11-12 MED ORDER — PRENATAL 27-0.8 MG PO TABS
1.0000 | ORAL_TABLET | Freq: Every day | ORAL | Status: AC
Start: 2023-11-12 — End: 2024-02-20

## 2023-11-12 NOTE — Progress Notes (Signed)
UPT positive. Patient plans to be seen at [facility] for prenatal care. Patient does have h/o GDM and HBP in first pregnancy; consulted with E. Sciora, CNM who advised that if patient was receiving medication for GDM and HBP in 1st pregnancy, she should be seen in a high-risk clinic. Patient denies being on any GDM or HBP medication during pregnancy.  The patient was dispensed prenatal vitamins #100 today. I provided counseling today regarding the medication. We discussed the medication, the side effects and when to call clinic.   Positive pregnancy packet reviewed and given to patient. Also counseled on hydration and when to seek medical attention.   Patient given the opportunity to ask questions. Questions answered.   Abagail Kitchens, RN

## 2023-11-14 ENCOUNTER — Emergency Department: Payer: Self-pay

## 2023-11-14 ENCOUNTER — Emergency Department
Admission: EM | Admit: 2023-11-14 | Discharge: 2023-11-15 | Disposition: A | Discharge status: Home / Self Care | Payer: Self-pay | Attending: Emergency Medicine | Admitting: Emergency Medicine

## 2023-11-14 ENCOUNTER — Other Ambulatory Visit: Payer: Self-pay

## 2023-11-14 ENCOUNTER — Encounter: Payer: Self-pay | Admitting: Emergency Medicine

## 2023-11-14 DIAGNOSIS — R1011 Right upper quadrant pain: Secondary | ICD-10-CM | POA: Insufficient documentation

## 2023-11-14 DIAGNOSIS — Z3A08 8 weeks gestation of pregnancy: Secondary | ICD-10-CM | POA: Insufficient documentation

## 2023-11-14 DIAGNOSIS — O26891 Other specified pregnancy related conditions, first trimester: Secondary | ICD-10-CM | POA: Insufficient documentation

## 2023-11-14 LAB — CBC WITH DIFFERENTIAL/PLATELET
Abs Immature Granulocytes: 0.02 10*3/uL (ref 0.00–0.07)
Basophils Absolute: 0.1 10*3/uL (ref 0.0–0.1)
Basophils Relative: 1 %
Eosinophils Absolute: 0.3 10*3/uL (ref 0.0–0.5)
Eosinophils Relative: 3 %
HCT: 32.6 % — ABNORMAL LOW (ref 36.0–46.0)
Hemoglobin: 10.8 g/dL — ABNORMAL LOW (ref 12.0–15.0)
Immature Granulocytes: 0 %
Lymphocytes Relative: 25 %
Lymphs Abs: 2.3 10*3/uL (ref 0.7–4.0)
MCH: 29.7 pg (ref 26.0–34.0)
MCHC: 33.1 g/dL (ref 30.0–36.0)
MCV: 89.6 fL (ref 80.0–100.0)
Monocytes Absolute: 0.7 10*3/uL (ref 0.1–1.0)
Monocytes Relative: 8 %
Neutro Abs: 5.8 10*3/uL (ref 1.7–7.7)
Neutrophils Relative %: 63 %
Platelets: 243 10*3/uL (ref 150–400)
RBC: 3.64 MIL/uL — ABNORMAL LOW (ref 3.87–5.11)
RDW: 12.7 % (ref 11.5–15.5)
WBC: 9.2 10*3/uL (ref 4.0–10.5)
nRBC: 0 % (ref 0.0–0.2)

## 2023-11-14 LAB — COMPREHENSIVE METABOLIC PANEL
ALT: 17 U/L (ref 0–44)
AST: 19 U/L (ref 15–41)
Albumin: 4 g/dL (ref 3.5–5.0)
Alkaline Phosphatase: 33 U/L — ABNORMAL LOW (ref 38–126)
Anion gap: 6 (ref 5–15)
BUN: 13 mg/dL (ref 6–20)
CO2: 25 mmol/L (ref 22–32)
Calcium: 8.7 mg/dL — ABNORMAL LOW (ref 8.9–10.3)
Chloride: 103 mmol/L (ref 98–111)
Creatinine, Ser: 0.57 mg/dL (ref 0.44–1.00)
GFR, Estimated: >60 mL/min (ref >60)
Glucose, Bld: 110 mg/dL — ABNORMAL HIGH (ref 70–99)
Potassium: 3.6 mmol/L (ref 3.5–5.1)
Sodium: 134 mmol/L — ABNORMAL LOW (ref 135–145)
Total Bilirubin: 0.5 mg/dL (ref <1.2)
Total Protein: 6.6 g/dL (ref 6.5–8.1)

## 2023-11-14 LAB — ABO/RH: ABO/RH(D): A POS

## 2023-11-14 LAB — HCG, QUANTITATIVE, PREGNANCY: hCG, Beta Chain, Quant, S: 124727 m[IU]/mL — ABNORMAL HIGH (ref <5)

## 2023-11-14 LAB — URINALYSIS, ROUTINE W REFLEX MICROSCOPIC
Bilirubin Urine: NEGATIVE
Glucose, UA: NEGATIVE mg/dL
Hgb urine dipstick: NEGATIVE
Ketones, ur: 5 mg/dL — AB
Leukocytes,Ua: NEGATIVE
Nitrite: NEGATIVE
Protein, ur: NEGATIVE mg/dL
Specific Gravity, Urine: 1.02 (ref 1.005–1.030)
pH: 6 (ref 5.0–8.0)

## 2023-11-14 LAB — POC URINE PREG, ED: Preg Test, Ur: POSITIVE — AB

## 2023-11-14 NOTE — ED Triage Notes (Signed)
Patient ambulatory to triage with complaints of right sided abdominal pain that started approx 5pm today. She is concerned because she is currently [redacted] weeks pregnant, 3rd pregnancy overall. Denies bleeding.

## 2023-11-15 ENCOUNTER — Emergency Department: Payer: Self-pay

## 2023-11-15 LAB — LIPASE, BLOOD: Lipase: 34 U/L (ref 11–51)

## 2023-11-15 MED ORDER — SUCRALFATE 1 GM/10ML PO SUSP: 1.0000 g | Freq: Four times a day (QID) | ORAL | 1 refills | Status: AC

## 2023-11-15 MED ORDER — ALUM & MAG HYDROXIDE-SIMETH 200-200-20 MG/5ML PO SUSP
30.0000 mL | Freq: Once | ORAL | Status: AC
  Administered 2023-11-15: 30 mL via ORAL
  Filled 2023-11-15: qty 30

## 2023-11-15 MED ORDER — ONDANSETRON 4 MG PO TBDP
4.0000 mg | ORAL_TABLET | Freq: Once | ORAL | Status: AC
  Administered 2023-11-15: 4 mg via ORAL
  Filled 2023-11-15: qty 1

## 2023-11-15 MED ORDER — LIDOCAINE VISCOUS HCL 2 % MT SOLN
15.0000 mL | Freq: Once | OROMUCOSAL | Status: AC
  Administered 2023-11-15: 15 mL via ORAL
  Filled 2023-11-15: qty 15

## 2023-11-15 MED ORDER — ONDANSETRON HCL 4 MG PO TABS: 4.0000 mg | ORAL_TABLET | Freq: Three times a day (TID) | ORAL | 0 refills | Status: AC | PRN

## 2023-11-15 NOTE — Discharge Instructions (Addendum)
Fortunately your testing in the emergency department did not show any emergency conditions that account for your pain.  There were no gallbladder stones.  Follow-up with gynecology and your primary doctor and trial sucralfate which can help with acid related stomach pain.    Thank you for choosing [place] for your health care today!  Please see your primary doctor this week for a follow up appointment.   If you have any new, worsening, or unexpected symptoms call your doctor right away or come back to the emergency department for reevaluation.  It was my pleasure to care for you today.   Daneil Dan [place] Charon, MD

## 2023-11-15 NOTE — ED Notes (Signed)
US at bedside

## 2023-11-15 NOTE — ED Provider Notes (Signed)
[facility] Provider Note    Event Date/Time   First MD Initiated Contact with Patient 11/14/23 2352     (approximate)   History   Abdominal Pain   HPI  Rebecca Blanchard is a 38 y.o. female   Past medical history of no significant past medical history presents the emergency department with right upper quadrant pain postprandially for the last several days.  Associate with nausea and vomiting.  No fevers.  No history of surgeries.  She is proximately [redacted] weeks pregnant by last menstrual period without confirmed IUP via imaging yet.  Has yet to establish care with gynecology.  No vaginal bleeding.  No new lower abdominal pain.  No urinary symptoms.  Independent Historian contributed to assessment above: Her partner is at bedside corroborate information past medical history as above    Physical Exam   Triage Vital Signs: ED Triage Vitals [11/14/23 2123]  Encounter Vitals Group     BP 119/62     Systolic BP Percentile      Diastolic BP Percentile      Pulse Rate 95     Resp 18     Temp 97.8 F (36.6 C)     Temp Source Oral     SpO2 98 %     Weight 125 lb (56.7 kg)     Height 5\' 5"  (1.651 m)     Head Circumference      Peak Flow      Pain Score 6     Pain Loc      Pain Education      Exclude from Growth Chart     Most recent vital signs: Vitals:   11/14/23 2123  BP: 119/62  Pulse: 95  Resp: 18  Temp: 97.8 F (36.6 C)  SpO2: 98%    General: Awake, no distress.  CV:  Good peripheral perfusion.  Resp:  Normal effort.  Abd:  No distention.  Other:  Right upper quadrant tenderness to palpation, other quadrants soft nontender deep palpation.  No rigidity or guarding.  Normal hemodynamics and afebrile.   ED Results / Procedures / Treatments   Labs (all labs ordered are listed, but only abnormal results are displayed) Labs Reviewed  CBC WITH DIFFERENTIAL/PLATELET - Abnormal; Notable for the following components:      Result Value   RBC  3.64 (*)    Hemoglobin 10.8 (*)    HCT 32.6 (*)    All other components within normal limits  COMPREHENSIVE METABOLIC PANEL - Abnormal; Notable for the following components:   Sodium 134 (*)    Glucose, Bld 110 (*)    Calcium 8.7 (*)    Alkaline Phosphatase 33 (*)    All other components within normal limits  HCG, QUANTITATIVE, PREGNANCY - Abnormal; Notable for the following components:   hCG, Beta Chain, Quant, S 161,096 (*)    All other components within normal limits  URINALYSIS, ROUTINE W REFLEX MICROSCOPIC - Abnormal; Notable for the following components:   Color, Urine YELLOW (*)    APPearance HAZY (*)    Ketones, ur 5 (*)    All other components within normal limits  POC URINE PREG, ED - Abnormal; Notable for the following components:   Preg Test, Ur Positive (*)    All other components within normal limits  LIPASE, BLOOD  ABO/RH     I ordered and reviewed the above labs they are notable for normal H&H and white blood cell count.  Normal LFTs and lipase.   RADIOLOGY I independently reviewed and interpreted pelvic ultrasound and see a single intrauterine pregnancy I also reviewed radiologist's formal read.   PROCEDURES:  Critical Care performed: No  Procedures   MEDICATIONS ORDERED IN ED: Medications  ondansetron (ZOFRAN-ODT) disintegrating tablet 4 mg (4 mg Oral Given 11/15/23 0037)  alum & mag hydroxide-simeth (MAALOX/MYLANTA) 200-200-20 MG/5ML suspension 30 mL (30 mLs Oral Given 11/15/23 0036)    And  lidocaine (XYLOCAINE) 2 % viscous mouth solution 15 mL (15 mLs Oral Given 11/15/23 0036)    IMPRESSION / MDM / ASSESSMENT AND PLAN / ED COURSE  I reviewed the triage vital signs and the nursing notes.                                Patient's presentation is most consistent with acute presentation with potential threat to life or bodily function.  Differential diagnosis includes, but is not limited to, ectopic pregnancy, biliary pathologies like  cholecystitis, biliary colic, choledocholithiasis, considered but less likely cholangitis.  Consider pancreatitis.  Consider GERD/gastritis/ulcer.   The patient is on the cardiac monitor to evaluate for evidence of arrhythmia and/or significant heart rate changes.  MDM:     Early pregnancy with right upper quadrant pain suspect biliary pathologies will get right upper quadrant ultrasound to assess more fully.  Also consider gastritis/GERD/ulcer we will start her on some antacid medications if diagnostics unrevealing for emergency pathologies tonight.   No ectopic noted on pelvic ultrasound which showed single intrauterine pregnancy.  Doubt appendicitis given no tenderness to the lower abdomen.  Defer RhoGAM given no bleeding.        FINAL CLINICAL IMPRESSION(S) / ED DIAGNOSES   Final diagnoses:  RUQ pain     Rx / DC Orders   ED Discharge Orders          Ordered    sucralfate (CARAFATE) 1 GM/10ML suspension  4 times daily        11/15/23 0123             Note:  This document was prepared using Dragon voice recognition software and may include unintentional dictation errors.    Pilar Jarvis, MD 11/15/23 [phone]
